# Patient Record
Sex: Female | Born: 1965 | Race: White | Hispanic: No | Marital: Married | Smoking: Never smoker
Health system: Southern US, Community
[De-identification: ages and names within clinical notes are randomized; demographics above are authoritative.]

## PROBLEM LIST (undated history)

## (undated) DIAGNOSIS — Z8669 Personal history of other diseases of the nervous system and sense organs: Secondary | ICD-10-CM

## (undated) DIAGNOSIS — R42 Dizziness and giddiness: Secondary | ICD-10-CM

## (undated) DIAGNOSIS — I10 Essential (primary) hypertension: Secondary | ICD-10-CM

## (undated) DIAGNOSIS — Z86711 Personal history of pulmonary embolism: Secondary | ICD-10-CM

## (undated) DIAGNOSIS — G43909 Migraine, unspecified, not intractable, without status migrainosus: Secondary | ICD-10-CM

## (undated) DIAGNOSIS — Z8701 Personal history of pneumonia (recurrent): Secondary | ICD-10-CM

## (undated) HISTORY — DX: Essential (primary) hypertension: I10

## (undated) HISTORY — DX: Personal history of pneumonia (recurrent): Z87.01

## (undated) HISTORY — PX: CHOLECYSTECTOMY: SHX55

## (undated) HISTORY — DX: Migraine, unspecified, not intractable, without status migrainosus: G43.909

## (undated) HISTORY — DX: Dizziness and giddiness: R42

---
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
## 1999-10-17 HISTORY — PX: APPENDECTOMY: SHX54

## 2006-01-05 ENCOUNTER — Ambulatory Visit: Payer: Self-pay | Admitting: Cardiology

## 2007-10-17 HISTORY — PX: PARTIAL HYSTERECTOMY: SHX80

## 2009-06-30 ENCOUNTER — Ambulatory Visit: Payer: Self-pay | Admitting: Cardiology

## 2009-10-16 DIAGNOSIS — Z86711 Personal history of pulmonary embolism: Secondary | ICD-10-CM

## 2009-10-16 HISTORY — DX: Personal history of pulmonary embolism: Z86.711

## 2011-01-23 DIAGNOSIS — R079 Chest pain, unspecified: Secondary | ICD-10-CM

## 2011-10-03 ENCOUNTER — Encounter: Payer: Self-pay | Admitting: Orthopedic Surgery

## 2011-10-03 ENCOUNTER — Telehealth: Payer: Self-pay | Admitting: Orthopedic Surgery

## 2011-10-03 ENCOUNTER — Ambulatory Visit (INDEPENDENT_AMBULATORY_CARE_PROVIDER_SITE_OTHER): Payer: BC Managed Care – PPO | Admitting: Orthopedic Surgery

## 2011-10-03 VITALS — BP 122/70 | Ht 62.0 in | Wt 125.0 lb

## 2011-10-03 DIAGNOSIS — M7500 Adhesive capsulitis of unspecified shoulder: Secondary | ICD-10-CM

## 2011-10-03 DIAGNOSIS — M75102 Unspecified rotator cuff tear or rupture of left shoulder, not specified as traumatic: Secondary | ICD-10-CM

## 2011-10-03 DIAGNOSIS — M67919 Unspecified disorder of synovium and tendon, unspecified shoulder: Secondary | ICD-10-CM

## 2011-10-03 MED ORDER — DICLOFENAC POTASSIUM 50 MG PO TABS: 50.0000 mg | ORAL_TABLET | Freq: Two times a day (BID) | ORAL | Status: AC

## 2011-10-03 MED ORDER — TRAMADOL-ACETAMINOPHEN 37.5-325 MG PO TABS: 1.0000 | ORAL_TABLET | ORAL | Status: AC | PRN

## 2011-10-03 NOTE — Patient Instructions (Addendum)
You have received a steroid shot. 15% of patients experience increased pain at the injection site with in the next 24 hours. This is best treated with ice and tylenol extra strength 2 tabs every 8 hours. If you are still having pain please call the office.   Start Physical therapy   Start 2 new medications   Call us if you have any problems

## 2011-10-03 NOTE — Telephone Encounter (Signed)
Patient having her physical therapy at [facility] in [place] rather than at [place], per patient's request due to proximity to workplace.  Order faxed to [facility] to fax [phone], the phone is # [phone].

## 2011-10-16 DIAGNOSIS — M75102 Unspecified rotator cuff tear or rupture of left shoulder, not specified as traumatic: Secondary | ICD-10-CM | POA: Insufficient documentation

## 2011-10-16 DIAGNOSIS — M7500 Adhesive capsulitis of unspecified shoulder: Secondary | ICD-10-CM | POA: Insufficient documentation

## 2011-10-16 NOTE — Progress Notes (Signed)
Patient ID: MENAAL RUSSUM, female   DOB: 1966-09-30, 45 y.o.   MRN: [id number] Chief complaint: Pain LEFT shoulder HPI:(81) This 45 year old female complains of pain in her LEFT shoulder after unsuccessful treatment by a chiropractor.  Her pain started gradually she did have a cortisone shot many years ago.  She complains of 10 out of 10 sharp dull throbbing burning pain in the LEFT upper extremity over the LEFT shoulder joint and acromion.  The pain is related to motion and activities related to overhead and extension.  Pain is somewhat relieved by bio freeze is worse with movement is described she does occasionally get some locking and catching as well as some tingling in her fingers.  Review of systems  ROS:(2) Review of systems shows a history of fatigue and migraines which affects her vision when she has that heartburn and frequency as well as vertigo.  She denies chest pain shortness of breath joint pain skin changes nervousness easy bleeding.   PFSH: (1)  Past Medical History  Diagnosis Date  . Migraines   . HTN (hypertension)   . Vertigo   . History of pneumonia      Physical Exam(12) GENERAL: normal development   CDV: pulses are normal   Skin: normal  Lymph: nodes were not palpable/normal  Psychiatric: awake, alert and oriented  Neuro: normal sensation  MSK Cervical exam shows normal range of motion with no tenderness and no negative Spurling's sign. 1 RIGHT shoulder seems to have full range of motion and normal strength.  Stability test in anterior posterior and inferior positions normal.  No tenderness. 2 LEFT shoulder tenderness along the anterior joint line and acromion.  No swelling.  A.c. Joint nontender.  Range of motion is limited active and passive.  Strength as could be tested normal. 3 LEFT shoulder provocative tests for impingement were limited by range of motion restriction but seemed normal. 4 LEFT hand full range of motion 5 LEFT elbow full range of  motion 6 Carpal tunnel test negative compression test  Imaging: X-rays were taken today they were normal  Assessment:  #1 frozen shoulder #2 rotator cuff syndrome    Plan:  #1 physical therapy #2 subacromial injections #3 anti-inflammatory and tramadol   Separately identifiable x-ray report   AP and lateral with 10 caudal tilt right  shoulder  Normal glenohumeral joint normal rotator outlet but no signs of chronic cuff disease  Impression normal shoulder x-rays    Subacromial Shoulder Injection Procedure Note  Pre-operative Diagnosis: left RC Syndrome  Post-operative Diagnosis: same  Indications: pain   Anesthesia: ethyl chloride   Procedure Details   Verbal consent was obtained for the procedure. The shoulder was prepped withalcohol and the skin was anesthetized. A 20 gauge needle was advanced into the subacromial space through posterior approach without difficulty  The space was then injected with 3 ml 1% lidocaine and 1 ml of depomedrol. The injection site was cleansed with isopropyl alcohol and a dressing was applied.  Complications:  None; patient tolerated the procedure well.

## 2013-06-19 DIAGNOSIS — R079 Chest pain, unspecified: Secondary | ICD-10-CM

## 2013-12-05 ENCOUNTER — Emergency Department (HOSPITAL_COMMUNITY): Payer: BC Managed Care – PPO

## 2013-12-05 ENCOUNTER — Encounter (HOSPITAL_COMMUNITY): Payer: Self-pay | Admitting: Emergency Medicine

## 2013-12-05 ENCOUNTER — Inpatient Hospital Stay (HOSPITAL_COMMUNITY)
Admission: EM | Admit: 2013-12-05 | Discharge: 2013-12-09 | DRG: 287 | Disposition: A | Discharge status: Home / Self Care | Payer: BC Managed Care – PPO | Attending: Internal Medicine | Admitting: Internal Medicine

## 2013-12-05 DIAGNOSIS — Z86711 Personal history of pulmonary embolism: Secondary | ICD-10-CM

## 2013-12-05 DIAGNOSIS — Z8669 Personal history of other diseases of the nervous system and sense organs: Secondary | ICD-10-CM

## 2013-12-05 DIAGNOSIS — I10 Essential (primary) hypertension: Secondary | ICD-10-CM | POA: Diagnosis present

## 2013-12-05 DIAGNOSIS — R911 Solitary pulmonary nodule: Secondary | ICD-10-CM | POA: Diagnosis present

## 2013-12-05 DIAGNOSIS — R079 Chest pain, unspecified: Principal | ICD-10-CM | POA: Diagnosis present

## 2013-12-05 DIAGNOSIS — M7500 Adhesive capsulitis of unspecified shoulder: Secondary | ICD-10-CM

## 2013-12-05 DIAGNOSIS — K219 Gastro-esophageal reflux disease without esophagitis: Secondary | ICD-10-CM | POA: Diagnosis present

## 2013-12-05 DIAGNOSIS — G510 Bell's palsy: Secondary | ICD-10-CM | POA: Diagnosis present

## 2013-12-05 DIAGNOSIS — M75102 Unspecified rotator cuff tear or rupture of left shoulder, not specified as traumatic: Secondary | ICD-10-CM

## 2013-12-05 DIAGNOSIS — Z8249 Family history of ischemic heart disease and other diseases of the circulatory system: Secondary | ICD-10-CM

## 2013-12-05 DIAGNOSIS — I200 Unstable angina: Secondary | ICD-10-CM

## 2013-12-05 DIAGNOSIS — G43909 Migraine, unspecified, not intractable, without status migrainosus: Secondary | ICD-10-CM | POA: Diagnosis present

## 2013-12-05 HISTORY — DX: Personal history of pulmonary embolism: Z86.711

## 2013-12-05 HISTORY — DX: Personal history of other diseases of the nervous system and sense organs: Z86.69

## 2013-12-05 LAB — BASIC METABOLIC PANEL
BUN: 15 mg/dL (ref 6–23)
CALCIUM: 9.6 mg/dL (ref 8.4–10.5)
CO2: 24 meq/L (ref 19–32)
CREATININE: 0.87 mg/dL (ref 0.50–1.10)
Chloride: 105 mEq/L (ref 96–112)
GFR calc Af Amer: 89 mL/min — ABNORMAL LOW (ref >90)
GFR calc non Af Amer: 77 mL/min — ABNORMAL LOW (ref >90)
Glucose, Bld: 89 mg/dL (ref 70–99)
Potassium: 3.8 mEq/L (ref 3.7–5.3)
Sodium: 141 mEq/L (ref 137–147)

## 2013-12-05 LAB — CBC WITH DIFFERENTIAL/PLATELET
BASOS PCT: 0 % (ref 0–1)
Basophils Absolute: 0 10*3/uL (ref 0.0–0.1)
EOS PCT: 1 % (ref 0–5)
Eosinophils Absolute: 0.1 10*3/uL (ref 0.0–0.7)
HCT: 37.1 % (ref 36.0–46.0)
Hemoglobin: 13 g/dL (ref 12.0–15.0)
LYMPHS PCT: 19 % (ref 12–46)
Lymphs Abs: 2 10*3/uL (ref 0.7–4.0)
MCH: 31.4 pg (ref 26.0–34.0)
MCHC: 35 g/dL (ref 30.0–36.0)
MCV: 89.6 fL (ref 78.0–100.0)
MONO ABS: 0.5 10*3/uL (ref 0.1–1.0)
Monocytes Relative: 5 % (ref 3–12)
Neutro Abs: 7.8 10*3/uL — ABNORMAL HIGH (ref 1.7–7.7)
Neutrophils Relative %: 74 % (ref 43–77)
Platelets: 211 10*3/uL (ref 150–400)
RBC: 4.14 MIL/uL (ref 3.87–5.11)
RDW: 12.9 % (ref 11.5–15.5)
WBC: 10.4 10*3/uL (ref 4.0–10.5)

## 2013-12-05 LAB — TROPONIN I
Troponin I: <0.3 ng/mL (ref <0.30)
Troponin I: <0.3 ng/mL (ref <0.30)

## 2013-12-05 LAB — PRO B NATRIURETIC PEPTIDE: Pro B Natriuretic peptide (BNP): 47.4 pg/mL (ref 0–125)

## 2013-12-05 MED ORDER — ONDANSETRON HCL 4 MG/2ML IJ SOLN: 4.0000 mg | Freq: Four times a day (QID) | INTRAMUSCULAR | Status: DC | PRN

## 2013-12-05 MED ORDER — NITROGLYCERIN 0.4 MG SL SUBL
0.4000 mg | SUBLINGUAL_TABLET | SUBLINGUAL | Status: DC | PRN
  Administered 2013-12-05 (×3): 0.4 mg via SUBLINGUAL

## 2013-12-05 MED ORDER — ACETAMINOPHEN 325 MG PO TABS: 650.0000 mg | ORAL_TABLET | Freq: Four times a day (QID) | ORAL | Status: DC | PRN

## 2013-12-05 MED ORDER — SODIUM CHLORIDE 0.9 % IJ SOLN
3.0000 mL | Freq: Two times a day (BID) | INTRAMUSCULAR | Status: DC
  Administered 2013-12-05 – 2013-12-08 (×6): 3 mL via INTRAVENOUS

## 2013-12-05 MED ORDER — ALUM & MAG HYDROXIDE-SIMETH 200-200-20 MG/5ML PO SUSP: 30.0000 mL | Freq: Four times a day (QID) | ORAL | Status: DC | PRN

## 2013-12-05 MED ORDER — ONDANSETRON HCL 4 MG PO TABS: 4.0000 mg | ORAL_TABLET | Freq: Four times a day (QID) | ORAL | Status: DC | PRN

## 2013-12-05 MED ORDER — METOPROLOL TARTRATE 12.5 MG HALF TABLET
12.5000 mg | ORAL_TABLET | Freq: Two times a day (BID) | ORAL | Status: DC
  Administered 2013-12-05 – 2013-12-08 (×7): 12.5 mg via ORAL
  Filled 2013-12-05 (×8): qty 1

## 2013-12-05 MED ORDER — ASPIRIN EC 325 MG PO TBEC
325.0000 mg | DELAYED_RELEASE_TABLET | Freq: Every day | ORAL | Status: DC
  Administered 2013-12-06 – 2013-12-09 (×3): 325 mg via ORAL
  Filled 2013-12-05 (×3): qty 1

## 2013-12-05 MED ORDER — SODIUM CHLORIDE 0.9 % IV SOLN: INTRAVENOUS | Status: DC

## 2013-12-05 MED ORDER — ENOXAPARIN SODIUM 60 MG/0.6ML ~~LOC~~ SOLN
60.0000 mg | Freq: Two times a day (BID) | SUBCUTANEOUS | Status: DC
  Administered 2013-12-06 – 2013-12-07 (×4): 60 mg via SUBCUTANEOUS
  Filled 2013-12-05 (×7): qty 0.6

## 2013-12-05 MED ORDER — IOHEXOL 350 MG/ML SOLN
100.0000 mL | Freq: Once | INTRAVENOUS | Status: AC | PRN
  Administered 2013-12-05: 100 mL via INTRAVENOUS

## 2013-12-05 MED ORDER — ALBUTEROL SULFATE (2.5 MG/3ML) 0.083% IN NEBU: 2.5000 mg | INHALATION_SOLUTION | RESPIRATORY_TRACT | Status: DC | PRN

## 2013-12-05 MED ORDER — ENOXAPARIN SODIUM 60 MG/0.6ML ~~LOC~~ SOLN
60.0000 mg | Freq: Two times a day (BID) | SUBCUTANEOUS | Status: DC
  Administered 2013-12-05: 60 mg via SUBCUTANEOUS
  Filled 2013-12-05 (×2): qty 0.8

## 2013-12-05 MED ORDER — ACETAMINOPHEN 650 MG RE SUPP: 650.0000 mg | Freq: Four times a day (QID) | RECTAL | Status: DC | PRN

## 2013-12-05 MED ORDER — TOPIRAMATE 25 MG PO TABS
150.0000 mg | ORAL_TABLET | Freq: Every day | ORAL | Status: DC
  Administered 2013-12-05 – 2013-12-08 (×4): 150 mg via ORAL
  Filled 2013-12-05 (×5): qty 2

## 2013-12-05 NOTE — ED Provider Notes (Addendum)
CSN: [id number]     Arrival date & time 12/05/13  1052 History  This chart was scribed for Shon Batonourtney F Soliyana Mcchristian, MD by Donne Anonayla Curran, ED Scribe. This patient was seen in room APA18/APA18 and the patient's care was started at 1055.    First MD Initiated Contact with Patient 12/05/13 1055     Chief Complaint  Patient presents with  . Chest Pain      The history is provided by the patient. No language interpreter was used.   HPI Comments: Patricia Silva is a 48 y.o. female brought in by ambulance, with hx of HTN and PE, who presents to the Emergency Department complaining of acute onset mid sternal CP (described sharp and crushing) that radiated to her left shoulder with associated diaphoresis. It began immediately PTA while she was at work (she is a Engineer, civil (consulting)nurse), and lasted 45 minutes. She states her pain was 10/10, and was relieved when EMS gave her 11 nitro en route. She took 4 baby Aspirin prior to arrival of EMS. She reports this pain feels similar to her previous PE. The cause of the previous PE is unknown. She denies recent travel. She denies SOB at the time of the pain or now. She is currently taking Augmentin for a sinus infection. She denies a hx of HTD, smoking, DM. She had a stress test performed by her PCP within the past year. She reports her father had a hx of early cardiac problems (multiple MIs before 40).    Past Medical History  Diagnosis Date  . Migraines   . HTN (hypertension)   . Vertigo   . History of pneumonia   . Personal history of PE (pulmonary embolism) 2011   Past Surgical History  Procedure Laterality Date  . Appendectomy    . Cholecystectomy     Family History  Problem Relation Age of Onset  . Heart disease    . Arthritis    . Lung disease    . Cancer     History  Substance Use Topics  . Smoking status: Never Smoker   . Smokeless tobacco: Not on file  . Alcohol Use: Yes   OB History   Grav Para Term Preterm Abortions TAB SAB Ect Mult Living                  Review of Systems  Constitutional: Positive for diaphoresis. Negative for fever.  Respiratory: Positive for chest tightness. Negative for cough and shortness of breath.   Cardiovascular: Negative for chest pain.  Gastrointestinal: Positive for nausea. Negative for vomiting and abdominal pain.  Genitourinary: Negative for dysuria.  Musculoskeletal: Negative for back pain.  Skin: Negative for rash.  Neurological: Negative for headaches.  Psychiatric/Behavioral: Negative for confusion.  All other systems reviewed and are negative.      Allergies  Imitrex and Morphine and related  Home Medications   Current Outpatient Rx  Name  Route  Sig  Dispense  Refill  . aspirin EC 81 MG tablet   Oral   Take 162 mg by mouth at bedtime.         Marland Kitchen. tiZANidine (ZANAFLEX) 4 MG tablet      Two tabs at onset of headache and two more if needed          . topiramate (TOPAMAX) 100 MG tablet      One and half tabs at bedtime           BP 131/74  Pulse 59  Temp(Src) 98.5 F (36.9 C) (Oral)  Resp 10  Ht 5\' 5"  (1.651 m)  Wt 125 lb (56.7 kg)  BMI 20.80 kg/m2  SpO2 100%  Physical Exam  Nursing note and vitals reviewed. Constitutional: She is oriented to person, place, and time. She appears well-developed and well-nourished.  HENT:  Head: Normocephalic and atraumatic.  Eyes: Pupils are equal, round, and reactive to light.  Neck: Neck supple.  Cardiovascular: Normal rate, regular rhythm and normal heart sounds.   No murmur heard. Pulmonary/Chest: Effort normal and breath sounds normal. No respiratory distress. She has no wheezes.  Abdominal: Soft. There is no tenderness.  Musculoskeletal: She exhibits no edema.  Neurological: She is alert and oriented to person, place, and time.  Skin: Skin is warm and dry.  Psychiatric: She has a normal mood and affect.    ED Course  Procedures (including critical care time) DIAGNOSTIC STUDIES: Oxygen Saturation is 100% on RA, normal by  my interpretation.    COORDINATION OF CARE: 11:00 AM Discussed treatment plan which includes CXR, CBC with differential, basic metabolic panel, pro b natriuretic peptide, EKG and troponin with pt at bedside and pt agreed to plan.    Labs Review Labs Reviewed  CBC WITH DIFFERENTIAL - Abnormal; Notable for the following:    Neutro Abs 7.8 (*)    All other components within normal limits  BASIC METABOLIC PANEL - Abnormal; Notable for the following:    GFR calc non Af Amer 77 (*)    GFR calc Af Amer 89 (*)    All other components within normal limits  PRO B NATRIURETIC PEPTIDE  TROPONIN I   Imaging Review Dg Chest 2 View  12/05/2013   CLINICAL DATA:  Chest pain  EXAM: CHEST  2 VIEW  COMPARISON:  None.  FINDINGS: The lungs are adequately inflated and clear. There is no focal infiltrate. There is no pleural effusion or pneumothorax. The cardiopericardial silhouette is normal in size. The pulmonary vascularity is not engorged. The mediastinum is normal in width. There is no pleural effusion. The observed portions of the bony thorax appear normal.  IMPRESSION: There is no evidence of active cardiopulmonary disease. 1 cannot exclude acute bronchitis in the appropriate clinical setting.   Electronically Signed   By: David  [place]   On: 12/05/2013 11:40   Ct Angio Chest W/cm &/or Wo Cm  12/05/2013   CLINICAL DATA:  Chest pain  EXAM: CT ANGIOGRAPHY CHEST WITH CONTRAST  TECHNIQUE: Multidetector CT imaging of the chest was performed using the standard protocol during bolus administration of intravenous contrast. Multiplanar CT image reconstructions and MIPs were obtained to evaluate the vascular anatomy.  CONTRAST:  OMNIPAQUE IOHEXOL 350 MG/ML SOLN  COMPARISON:  Chest CT angiogram January 21, 2010 and chest radiograph December 05, 2013  FINDINGS: There is no demonstrable pulmonary embolus. There is no thoracic aortic aneurysm or dissection.  On axial slice 55, series 5, there is a stable 3 mm nodular  opacity in the superior segment of the right lower lobe. Elsewhere the lungs are clear. No edema or consolidation.  There is no appreciable thoracic adenopathy. The pericardium is not thickened.  Visualized upper abdominal structures appear normal. There are no blastic or lytic bone lesions.  Review of the MIP images confirms the above findings.  IMPRESSION: No demonstrable pulmonary embolus. No edema or consolidation. Stable 3 mm nodular opacity right lower lobe. Stability since 2011 is consistent with benign etiology.   Electronically Signed   By: Chrissie Noa  Margarita Grizzle M.D.   On: 12/05/2013 13:18    EKG Interpretation    Date/Time:  Friday December 05 2013 11:01:12 EST Ventricular Rate:  63 PR Interval:  146 QRS Duration: 78 QT Interval:  404 QTC Calculation: 413 R Axis:   62 Text Interpretation:  Normal sinus rhythm Nonspecific ST abnormality Abnormal ECG No previous ECGs available Confirmed by Patricia Vogelgesang  MD, Patricia Silva ([id number]) on 12/05/2013 11:14:50 AM            MDM   Final diagnoses:  Chest pain    Patient presents with substernal chest pain. She has risk factors for ACS and has a history of PE. She is chest pain-free at this time. She received aspirin in route. EKG is nonischemic.  Patient did have one recurrent episode of chest pain in the ER. Repeat EKG continues to be reassuring. CT chest is negative for PE.  Will admit patient for formal rule out.  I personally performed the services described in this documentation, which was scribed in my presence. The recorded information has been reviewed and is accurate.   Shon Baton, MD 12/05/13 1332  Have spoken with Dr. Waymon Amato who would like the patient to be transferred to the hospital for possible stress testing tomorrow. I have put in transfer orders and EMTALA.  Shon Baton, MD 12/05/13 1455

## 2013-12-05 NOTE — ED Notes (Addendum)
Pt was at work and experienced chest pain 10/10, EKG, FS 121, EMS started IV, gave 324 ASA, nitro x1 that relieved pain completely, pt denies chest pain currently. Pt has HX of PE.

## 2013-12-05 NOTE — H&P (Addendum)
History and Physical  Patricia LoosenKaron D Monteith WUJ:[id number] DOB: 06/26/1966 DOA: 12/05/2013  Referring physician: EDP PCP: Ignatius SpeckingVYAS,DHRUV B., MD  Outpatient Specialists:  1. 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 The patient states that she was in her usual state of health when she went to work this morning. While at work, she was sitting at her desk when she experienced sudden onset of lower substernal crushing type of chest pain, 10/10 in severity which she initially felt was gas-related pain and took some TUMS without relief. Subsequently this pain radiated to the left shoulder, was associated with dyspnea and diaphoresis. She then had worsening of chest pain with deep inspiration. EMS was called and on route to the ED, she received a dose of sublingual nitroglycerin with prompt relief of chest pain-pain lasted about 45 minutes in total. Workup in the ED revealed troponin x1 negative, EKG without acute changes and CT chest without PE. She had couple of episodes of chest pain again in the ED which were relieved by sublingual nitroglycerin. Currently she is chest pain-free but has mild headache from NTG. She states that this pain is not similar to her GERD pain. Cardiology has evaluated patient in the ED and [facility]. Hospitalist admission requested.   Review of Systems: All systems reviewed and apart from history of presenting illness, are negative.  Past Medical History  Diagnosis Date  . Migraines   . HTN (hypertension)   . Vertigo   . History of pneumonia   . Personal history of PE  (pulmonary embolism) 2011  . History of Bell's palsy    Past Surgical History  Procedure Laterality Date  . Appendectomy    . Cholecystectomy     Social History:  reports that she has never smoked. She does not have any smokeless tobacco history on file. She reports that she drinks alcohol. She reports that she does not use illicit drugs. Married. Independent of activities of daily living.  Allergies  Allergen Reactions  . Imitrex [Sumatriptan Base] Anaphylaxis and Other (See Comments)    Patient states "heart beats really fast and throat itches and my throat feels like it gets tight"  . Morphine And Related     Family History  Problem Relation Age of Onset  . Heart disease    . Arthritis    . Lung disease    . Cancer    . Pulmonary embolism Mother   . Heart disease Father   . Pulmonary embolism Brother     Prior to Admission medications   Medication Sig Start Date End Date Taking? Authorizing Provider  aspirin EC 81 MG tablet Take 162 mg by mouth at bedtime.   Yes Historical Provider, MD  tiZANidine (ZANAFLEX) 4 MG tablet Two tabs at onset of headache and two more if needed    Yes Historical Provider, MD  topiramate (TOPAMAX) 100 MG tablet One and half tabs at bedtime    Yes Historical Provider, MD   Physical Exam: Filed Vitals:   12/05/13 1345 12/05/13 1400 12/05/13 1415 12/05/13 1430  BP: 118/70 117/62 126/60 139/74  Pulse: 57 65  70 77  Temp:      TempSrc:      Resp: 13 10 14 15   Height:      Weight:      SpO2:       temperature 98.46F and oxygen saturation 100%.   General exam: Moderately built and nourished young female patient, lying comfortably supine on the gurney in no obvious distress.  Head, eyes and ENT: Nontraumatic and normocephalic. Pupils equally reacting to light and accommodation. Oral mucosa moist.  Neck: Supple. No JVD, carotid bruit or thyromegaly.  Lymphatics: No lymphadenopathy.  Respiratory system: Clear to auscultation. No increased  work of breathing. No reproducible chest pain.  Cardiovascular system: S1 and S2 heard, RRR. No JVD, murmurs, gallops, clicks or pedal edema.  Gastrointestinal system: Abdomen is nondistended, soft and nontender. Normal bowel sounds heard. No organomegaly or masses appreciated.  Central nervous system: Alert and oriented. No new focal neurological deficits. Diminished right nasolabial fold from prior Bell's palsy.  Extremities: Symmetric 5 x 5 power. Peripheral pulses symmetrically felt.   Skin: No rashes or acute findings.  Musculoskeletal system: Negative exam.  Psychiatry: Pleasant and cooperative.   Labs on Admission:  Basic Metabolic Panel:  Recent Labs Lab 12/05/13 1140  NA 141  K 3.8  CL 105  CO2 24  GLUCOSE 89  BUN 15  CREATININE 0.87  CALCIUM 9.6   Liver Function Tests: No results found for this basename: AST, ALT, ALKPHOS, BILITOT, PROT, ALBUMIN,  in the last 168 hours No results found for this basename: LIPASE, AMYLASE,  in the last 168 hours No results found for this basename: AMMONIA,  in the last 168 hours CBC:  Recent Labs Lab 12/05/13 1140  WBC 10.4  NEUTROABS 7.8*  HGB 13.0  HCT 37.1  MCV 89.6  PLT 211   Cardiac Enzymes:  Recent Labs Lab 12/05/13 1140  TROPONINI <0.30    BNP (last 3 results)  Recent Labs  12/05/13 1140  PROBNP 47.4   CBG: No results found for this basename: GLUCAP,  in the last 168 hours  Radiological Exams on Admission: Dg Chest 2 View  12/05/2013   CLINICAL DATA:  Chest pain  EXAM: CHEST  2 VIEW  COMPARISON:  None.  FINDINGS: The lungs are adequately inflated and clear. There is no focal infiltrate. There is no pleural effusion or pneumothorax. The cardiopericardial silhouette is normal in size. The pulmonary vascularity is not engorged. The mediastinum is normal in width. There is no pleural effusion. The observed portions of the bony thorax appear normal.  IMPRESSION: There is no evidence of active  cardiopulmonary disease. 1 cannot exclude acute bronchitis in the appropriate clinical setting.   Electronically Signed   By: David  [place]   On: 12/05/2013 11:40   Ct Angio Chest W/cm &/or Wo Cm  12/05/2013   CLINICAL DATA:  Chest pain  EXAM: CT ANGIOGRAPHY CHEST WITH CONTRAST  TECHNIQUE: Multidetector CT imaging of the chest was performed using the standard protocol during bolus administration of intravenous contrast. Multiplanar CT image reconstructions and MIPs were obtained to evaluate the vascular anatomy.  CONTRAST:  OMNIPAQUE IOHEXOL 350 MG/ML SOLN  COMPARISON:  Chest CT angiogram January 21, 2010 and chest radiograph December 05, 2013  FINDINGS: There is no demonstrable pulmonary embolus. There is no thoracic aortic aneurysm or dissection.  On axial slice 55, series 5, there is a stable 3 mm nodular opacity in the superior segment of the right lower lobe. Elsewhere the lungs are  clear. No edema or consolidation.  There is no appreciable thoracic adenopathy. The pericardium is not thickened.  Visualized upper abdominal structures appear normal. There are no blastic or lytic bone lesions.  Review of the MIP images confirms the above findings.  IMPRESSION: No demonstrable pulmonary embolus. No edema or consolidation. Stable 3 mm nodular opacity right lower lobe. Stability since 2011 is consistent with benign etiology.   Electronically Signed   By: Bretta Bang M.D.   On: 12/05/2013 13:18    EKG: Independently reviewed. Normal sinus rhythm, normal axis and no acute changes.  Assessment/Plan Principal Problem:   Chest pain Active Problems:   History of pulmonary embolism   History of Bell's palsy   HTN (hypertension)   1. Chest pain: Concerning for unstable angina. She has mostly typical and some atypical features for ischemic type of chest pain. Risk factors for CAD are hypertension and strong family history of CAD. Since she has strong personal and family history of VTE, CTA chest was  done and PE ruled out. Admit to telemetry at [facility] for observation and evaluation. Cycle cardiac enzymes. Continue aspirin and sublingual NTG. Cardiology has consulted at [facility] ED and recommend initiation of full dose Lovenox and low dose metoprolol and transfer to [facility] for further evaluation-possibly cardiac cath. Recent nuclear stress testing within the last 6 months was negative. Check 2-D echo and fasting lipid panel. 2. Hypertension: Controlled off medications. 3. History of pulmonary embolism: Negative CTA chest this admission. 4. History of recurrent Bell's palsy: 5. Pulmonary nodule: Stable 3 MM RLL nodule-stable compared to prior imaging. Outpatient followup. 6. History of migraine:     Code Status: Full  Family Communication: Discussed with spouse at bedside.  Disposition Plan: Transfer to [facility]   Time spent: 55 minutes  [facility], MD, FACP, FHM. [facility] Pager [phone]  If 7PM-7AM, please contact night-coverage [email or web address] Password [facility] 12/05/2013, 3:26 PM

## 2013-12-05 NOTE — Consult Note (Signed)
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 Reports episode of 10/10 chest pressure in mid chest with associated diaphoresis starting this morning while sitting at work. Pain lasted for approx 45 minutes and was not resolved until she took NG by EMS. Second episode occurred in ER with similar symptoms, relieved once again with NG. She reports prior history of chest pain several months ago that was much more mild. States she had a stress test ordered by her PCP around that timethat was normal. Her symptoms today are much more severe and different from prior. She also reports some increased DOE over the last few weeks that she has attributed to recent sinus infection. No orthopnea, no PND, no LE edema.   EKG NSR, LAE, non-spec ST/T changes, trop neg x 1, pro-BNP 47, Cr 0.87, GFR 89, K 3.8. CT PE is negative.    CAD risk factors: HTN, father with MI is his 66s.    Allergies  Allergen Reactions  . Imitrex [Sumatriptan Base] Anaphylaxis and Other (See Comments)    Patient states "heart beats really fast and throat itches and my throat feels like it gets tight"  . Morphine And Related     Medications Scheduled Medications:     Infusions:     PRN Medications:  nitroGLYCERIN   Past Medical History  Diagnosis Date  . Migraines   . HTN (hypertension)   . Vertigo   . History of pneumonia   . Personal history of PE (pulmonary embolism) 2011    Past Surgical History  Procedure Laterality Date  . Appendectomy    . Cholecystectomy      Family History  Problem Relation Age of Onset  . Heart disease    . Arthritis    . Lung disease    . Cancer      Social History [name] reports that she has never smoked. She does not have any smokeless tobacco history on file. [name] reports that she drinks alcohol.  Review of Systems CONSTITUTIONAL: No  weight loss, fever, chills, weakness or fatigue.  HEENT: Eyes: No visual loss, blurred vision, double vision or yellow sclerae. No hearing loss, sneezing, congestion, runny nose or sore throat.  SKIN: No rash or itching.  CARDIOVASCULAR: per HPI RESPIRATORY: No shortness of breath, cough or sputum.  GASTROINTESTINAL: No anorexia, nausea, vomiting or diarrhea. No abdominal pain or blood.  GENITOURINARY: no polyuria, no dysuria NEUROLOGICAL: No headache, dizziness, syncope, paralysis, ataxia, numbness or tingling in the extremities. No change in bowel or bladder control.  MUSCULOSKELETAL: No muscle, back pain, joint pain or stiffness.  HEMATOLOGIC: No anemia, bleeding or bruising.  LYMPHATICS: No enlarged nodes. No history of splenectomy.  PSYCHIATRIC: No history of depression or anxiety.      Physical Examination Blood pressure 120/75, pulse 57, temperature 98.5 F (36.9 C), temperature source Oral, resp. rate 11, height 5\' 5"  (1.651 m), weight 125 lb (56.7 kg), SpO2 100.00%. No intake or output data in the 24 hours ending 12/05/13 1410  Cardiovascular: RRR, no m/r/g,   Respiratory: CTAB  GI: abdomen soft, NT, ND  MSK: no LE edema  Neuro: no focal deficits  Psych: appropriate affect   Lab Results  Basic Metabolic Panel:  Recent Labs Lab 12/05/13 1140  NA 141  K 3.8  CL 105  CO2 24  GLUCOSE 89  BUN 15  CREATININE 0.87  CALCIUM 9.6    Liver Function Tests: No results found for this basename: AST, ALT, ALKPHOS, BILITOT, PROT, ALBUMIN,  in the last 168 hours  CBC:  Recent Labs Lab 12/05/13 1140  WBC 10.4  NEUTROABS 7.8*  HGB 13.0  HCT 37.1  MCV 89.6  PLT 211    Cardiac Enzymes:  Recent Labs Lab 12/05/13 1140  TROPONINI <0.30    BNP: No components found with this basename: POCBNP,    ECG NSR, LAE, non-spec ST/T changes  Imaging 12/05/13 CXR FINDINGS:  The lungs are adequately inflated and clear. There is no focal  infiltrate. There is no  pleural effusion or pneumothorax. The  cardiopericardial silhouette is normal in size. The pulmonary  vascularity is not engorged. The mediastinum is normal in width.  There is no pleural effusion. The observed portions of the bony  thorax appear normal.  IMPRESSION:  There is no evidence of active cardiopulmonary disease. 1 cannot  exclude acute bronchitis in the appropriate clinical setting.  12/05/13 CT PE IMPRESSION:  No demonstrable pulmonary embolus. No edema or consolidation. Stable  3 mm nodular opacity right lower lobe. Stability since 2011 is  consistent with benign etiology.   Impression/Recommendations  1. Chest pain - by history symptoms are concerning for angina, 10/10 midchest tightness w/ associated diaphoresis lasting approx 40 minutes, resolved with NG. She has had 2 episodes today both resolved with NG. She has a strong family history of early CAD, with her father having multiple MI in his early 7140s.  - we were able to obtain the stress test she referred to, [facility]. She exercised 8 min 30 sec, achieving 10 METs, 86% of THR, without ischemic EKG changes, no perfusion defects.LVEF 74%.  - Despite these fairly recent stress test results, given her strong family history of early CAD and progression of anginal like symptoms [facility] and [facility] diagnosis of CAD. We are limited from cardiac standpoint in our ability to manage her here at [facility] over the weekend.  - recommend ASA, statin, and beta blocker. - check lipid panel, echo - her TIMI score is 2 consistent with low risk for major cardiac events. However, she has had recurring symptoms since being in the ER, will initiate anticoagulation with lovenox.    Patricia RichJonathan Azoria Silva, M.D., F.A.C.C.

## 2013-12-06 DIAGNOSIS — I200 Unstable angina: Secondary | ICD-10-CM | POA: Diagnosis present

## 2013-12-06 DIAGNOSIS — I059 Rheumatic mitral valve disease, unspecified: Secondary | ICD-10-CM

## 2013-12-06 DIAGNOSIS — Z8249 Family history of ischemic heart disease and other diseases of the circulatory system: Secondary | ICD-10-CM

## 2013-12-06 DIAGNOSIS — Z86711 Personal history of pulmonary embolism: Secondary | ICD-10-CM

## 2013-12-06 DIAGNOSIS — G43909 Migraine, unspecified, not intractable, without status migrainosus: Secondary | ICD-10-CM

## 2013-12-06 LAB — LIPID PANEL
CHOL/HDL RATIO: 2.5 ratio
Cholesterol: 130 mg/dL (ref 0–200)
HDL: 52 mg/dL (ref >39)
LDL CALC: 62 mg/dL (ref 0–99)
Triglycerides: 82 mg/dL (ref <150)
VLDL: 16 mg/dL (ref 0–40)

## 2013-12-06 LAB — TROPONIN I: Troponin I: <0.3 ng/mL (ref <0.30)

## 2013-12-06 NOTE — Progress Notes (Signed)
Subjective:  Yesterday reported episode of 10 over 10 chest pressure with diaphoresis starting in the morning while sitting at work lasting 50 minutes and not resolve until nitroglycerin was administered. She had more mild chest discomfort several months ago. She had a stress test ordered by her primary physician a few months ago that was "normal". Exercise stress nuclear at [facility] 9/14-exercise for 8 minutes and 30 seconds without ischemic EKG changes with no perfusion defects. EF was 74%. Symptoms yesterday were more severe. Mild shortness of breath as well. She thought this was secondary to a sinus infection.  EKG shows nonspecific ST changes, BNP was normal at 47, creatinine 0.87, CT scan of chest was negative PE.  Father had myocardial infarction in his 79s.  Given the severity of her symptoms, she was seen by Dr. Wyline Mood at [facility] and transferred to [place] for further evaluation and consideration of left heart catheterization for definitive diagnosis of coronary artery disease.  Extensive review of medical records.  Lovenox was begun.  Currently she is feeling well, no chest discomfort. Mildly anxious.  Objective:  Vital Signs in the last 24 hours: Temp:  [97.1 F (36.2 C)-98.5 F (36.9 C)] 97.8 F (36.6 C) (02/21 0523) Pulse Rate:  [57-77] 70 (02/21 0523) Resp:  [10-20] 18 (02/21 0523) BP: (103-139)/(55-105) 116/73 mmHg (02/21 0523) SpO2:  [99 %-100 %] 100 % (02/21 0523) Weight:  [125 lb (56.7 kg)-126 lb 15.8 oz (57.6 kg)] 126 lb 15.8 oz (57.6 kg) (02/21 0523)  Intake/Output from previous day:     Physical Exam: General: Well developed, well nourished, in no acute distress. Head:  Normocephalic and atraumatic. Lungs: Clear to auscultation and percussion. Heart: Normal S1 and S2.  No murmur, rubs or gallops.  Abdomen: soft, non-tender, positive bowel sounds. Extremities: No clubbing or cyanosis. No edema. Neurologic: Alert and oriented x 3.  Anxious    Lab Results:  Recent Labs  12/05/13 1140  WBC 10.4  HGB 13.0  PLT 211    Recent Labs  12/05/13 1140  NA 141  K 3.8  CL 105  CO2 24  GLUCOSE 89  BUN 15  CREATININE 0.87    Recent Labs  12/06/13 0111 12/06/13 0815  TROPONINI <0.30 <0.30    Recent Labs  12/06/13 0115  CHOL 130   LDL 62. Imaging: Dg Chest 2 View  12/05/2013   CLINICAL DATA:  Chest pain  EXAM: CHEST  2 VIEW  COMPARISON:  None.  FINDINGS: The lungs are adequately inflated and clear. There is no focal infiltrate. There is no pleural effusion or pneumothorax. The cardiopericardial silhouette is normal in size. The pulmonary vascularity is not engorged. The mediastinum is normal in width. There is no pleural effusion. The observed portions of the bony thorax appear normal.  IMPRESSION: There is no evidence of active cardiopulmonary disease. 1 cannot exclude acute bronchitis in the appropriate clinical setting.   Electronically Signed   By: David  [place]   On: 12/05/2013 11:40   Ct Angio Chest W/cm &/or Wo Cm  12/05/2013   CLINICAL DATA:  Chest pain  EXAM: CT ANGIOGRAPHY CHEST WITH CONTRAST  TECHNIQUE: Multidetector CT imaging of the chest was performed using the standard protocol during bolus administration of intravenous contrast. Multiplanar CT image reconstructions and MIPs were obtained to evaluate the vascular anatomy.  CONTRAST:  OMNIPAQUE IOHEXOL 350 MG/ML SOLN  COMPARISON:  Chest CT angiogram January 21, 2010 and chest radiograph December 05, 2013  FINDINGS:  There is no demonstrable pulmonary embolus. There is no thoracic aortic aneurysm or dissection.  On axial slice 55, series 5, there is a stable 3 mm nodular opacity in the superior segment of the right lower lobe. Elsewhere the lungs are clear. No edema or consolidation.  There is no appreciable thoracic adenopathy. The pericardium is not thickened.  Visualized upper abdominal structures appear normal. There are no blastic or lytic bone  lesions.  Review of the MIP images confirms the above findings.  IMPRESSION: No demonstrable pulmonary embolus. No edema or consolidation. Stable 3 mm nodular opacity right lower lobe. Stability since 2011 is consistent with benign etiology.   Electronically Signed   By: Bretta BangWilliam  Woodruff M.D.   On: 12/05/2013 13:18   Personally viewed.   Telemetry: No adverse arrhythmias Personally viewed.   EKG:  12/05/13 at 11:26 AM-sinus rhythm with nonspecific ST-T wave changes.  Cardiac Studies:  Nuclear stress test as above.  Assessment/Plan:  Principal Problem:   Chest pain Active Problems:   History of pulmonary embolism   History of Bell's palsy   HTN (hypertension)   48 year old female with strong early family history of CAD with return of severe chest discomfort, nonspecific ST-T wave changes on EKG with prior nuclear stress test 9/14 that was low risk with no ischemia.  1. Chest pain-Dr. Wyline MoodBranch with cardiology saw her in consultation yesterday and sent her to [place] for further evaluation for possible heart catheterization. I discussed with her the risks and benefits of cardiac catheterization including stroke, heart attack, death, renal impairment, bleeding. Questions were answered. She is willing to proceed. This will be performed on Monday unless symptoms worsen or become more worrisome over the weekend. Discussion.  2. History of pulmonary embolism-no evidence of PE on CT.  3. Early family history of CAD-father had MI in his 8040s.  We will continue to monitor. Allow for diet. N.p.o. past midnight on Sunday.  Patricia Silva 12/06/2013, 9:27 AM

## 2013-12-06 NOTE — Progress Notes (Signed)
Pt had a 3.22 second pause, stripped saved to EPIC. Pt was asymptomatic, resting in bed. VSS. Patricia McgregorMary Silva notified, will continue to monitor.

## 2013-12-06 NOTE — Progress Notes (Signed)
  Echocardiogram 2D Echocardiogram has been performed.  Cathie BeamsGREGORY, Tania Steinhauser 12/06/2013, 11:52 AM

## 2013-12-06 NOTE — Progress Notes (Signed)
[facility] PROGRESS NOTE   MONAI HINDES ZOX:[id number] DOB: Jan 01, 1966 DOA: 12/05/2013 PCP: Ignatius Specking., MD  HPI/Subjective: Denies any chest pain since admission to the hospital. Denies any palpitations or shortness of breath.  Assessment/Plan: Principal Problem:   Chest pain Active Problems:   History of pulmonary embolism   History of Bell's palsy   HTN (hypertension)    Chest pain -Pain characteristics is concerning for unstable angina, cardiology on board. -CT angiography was done and rule out PE. -Negative 3 sets of cardiac enzymes so far. -Patient started on metoprolol, anticoagulation with Lovenox and aspirin/Plavix. -Patient for cardiac catheterization on Monday.  GERD -History of GERD and remote history of esophageal stricture S/P dilatation. -Currently no active symptoms. -Be chest pain she had is not similar to the GERD burning sensation she used to get before.  History of PE -CT angiography done this admission showed no evidence of PE or other pulmonary acute findings. -There is 3 mm right lower lobe nodule stable compared with prior imaging, outpatient followup is recommended.  Code Status: Full code Family Communication: Plan discussed with the patient. Disposition Plan: Remains inpatient   Consultants:  Cardiology  Procedures:  None  Antibiotics:  None   Objective: Filed Vitals:   12/06/13 0947  BP: 114/63  Pulse: 68  Temp:   Resp:    No intake or output data in the 24 hours ending 12/06/13 1135 Filed Weights   12/05/13 1101 12/06/13 0523  Weight: 56.7 kg (125 lb) 57.6 kg (126 lb 15.8 oz)    Exam: General: Alert and awake, oriented x3, not in any acute distress. HEENT: anicteric sclera, pupils reactive to light and accommodation, EOMI CVS: S1-S2 clear, no murmur rubs or gallops Chest: clear to auscultation bilaterally, no wheezing, rales or rhonchi Abdomen: soft nontender, nondistended, normal bowel sounds, no  organomegaly Extremities: no cyanosis, clubbing or edema noted bilaterally Neuro: Cranial nerves II-XII intact, no focal neurological deficits  Data Reviewed: Basic Metabolic Panel:  Recent Labs Lab 12/05/13 1140  NA 141  K 3.8  CL 105  CO2 24  GLUCOSE 89  BUN 15  CREATININE 0.87  CALCIUM 9.6   Liver Function Tests: No results found for this basename: AST, ALT, ALKPHOS, BILITOT, PROT, ALBUMIN,  in the last 168 hours No results found for this basename: LIPASE, AMYLASE,  in the last 168 hours No results found for this basename: AMMONIA,  in the last 168 hours CBC:  Recent Labs Lab 12/05/13 1140  WBC 10.4  NEUTROABS 7.8*  HGB 13.0  HCT 37.1  MCV 89.6  PLT 211   Cardiac Enzymes:  Recent Labs Lab 12/05/13 1140 12/05/13 2001 12/06/13 0111 12/06/13 0815  TROPONINI <0.30 <0.30 <0.30 <0.30   BNP (last 3 results)  Recent Labs  12/05/13 1140  PROBNP 47.4   CBG: No results found for this basename: GLUCAP,  in the last 168 hours  Micro No results found for this or any previous visit (from the past 240 hour(s)).   Studies: Dg Chest 2 View  12/05/2013   CLINICAL DATA:  Chest pain  EXAM: CHEST  2 VIEW  COMPARISON:  None.  FINDINGS: The lungs are adequately inflated and clear. There is no focal infiltrate. There is no pleural effusion or pneumothorax. The cardiopericardial silhouette is normal in size. The pulmonary vascularity is not engorged. The mediastinum is normal in width. There is no pleural effusion. The observed portions of the bony thorax appear normal.  IMPRESSION: There is no evidence of  active cardiopulmonary disease. 1 cannot exclude acute bronchitis in the appropriate clinical setting.   Electronically Signed   By: David  [place]   On: 12/05/2013 11:40   Ct Angio Chest W/cm &/or Wo Cm  12/05/2013   CLINICAL DATA:  Chest pain  EXAM: CT ANGIOGRAPHY CHEST WITH CONTRAST  TECHNIQUE: Multidetector CT imaging of the chest was performed using the standard protocol  during bolus administration of intravenous contrast. Multiplanar CT image reconstructions and MIPs were obtained to evaluate the vascular anatomy.  CONTRAST:  100mL OMNIPAQUE IOHEXOL 350 MG/ML SOLN  COMPARISON:  Chest CT angiogram January 21, 2010 and chest radiograph December 05, 2013  FINDINGS: There is no demonstrable pulmonary embolus. There is no thoracic aortic aneurysm or dissection.  On axial slice 55, series 5, there is a stable 3 mm nodular opacity in the superior segment of the right lower lobe. Elsewhere the lungs are clear. No edema or consolidation.  There is no appreciable thoracic adenopathy. The pericardium is not thickened.  Visualized upper abdominal structures appear normal. There are no blastic or lytic bone lesions.  Review of the MIP images confirms the above findings.  IMPRESSION: No demonstrable pulmonary embolus. No edema or consolidation. Stable 3 mm nodular opacity right lower lobe. Stability since 2011 is consistent with benign etiology.   Electronically Signed   By: Bretta BangWilliam  Woodruff M.D.   On: 12/05/2013 13:18    Scheduled Meds: . aspirin EC  325 mg Oral Daily  . enoxaparin (LOVENOX) injection  60 mg Subcutaneous Q12H  . metoprolol tartrate  12.5 mg Oral BID  . sodium chloride  3 mL Intravenous Q12H  . topiramate  150 mg Oral QHS   Continuous Infusions: . sodium chloride         Time spent: 35 minutes    [facility] Center[facility]MAHI,Francia Verry A  [facility] Pager [phone] If 7PM-7AM, please contact night-coverage at [email or web address], password [email or web address] 12/06/2013, 11:35 AM  LOS: 1 day

## 2013-12-07 DIAGNOSIS — I200 Unstable angina: Secondary | ICD-10-CM

## 2013-12-07 DIAGNOSIS — M67919 Unspecified disorder of synovium and tendon, unspecified shoulder: Secondary | ICD-10-CM

## 2013-12-07 DIAGNOSIS — M719 Bursopathy, unspecified: Secondary | ICD-10-CM

## 2013-12-07 DIAGNOSIS — M7500 Adhesive capsulitis of unspecified shoulder: Secondary | ICD-10-CM

## 2013-12-07 MED ORDER — SODIUM CHLORIDE 0.9 % IV SOLN: 250.0000 mL | INTRAVENOUS | Status: DC | PRN

## 2013-12-07 MED ORDER — SODIUM CHLORIDE 0.9 % IJ SOLN: 3.0000 mL | INTRAMUSCULAR | Status: DC | PRN

## 2013-12-07 MED ORDER — ASPIRIN 81 MG PO CHEW
81.0000 mg | CHEWABLE_TABLET | ORAL | Status: AC
  Administered 2013-12-08: 81 mg via ORAL
  Filled 2013-12-07: qty 1

## 2013-12-07 MED ORDER — SODIUM CHLORIDE 0.9 % IV SOLN
1.0000 mL/kg/h | INTRAVENOUS | Status: DC
  Administered 2013-12-08: 1 mL/kg/h via INTRAVENOUS

## 2013-12-07 MED ORDER — SODIUM CHLORIDE 0.9 % IJ SOLN
3.0000 mL | Freq: Two times a day (BID) | INTRAMUSCULAR | Status: DC
  Administered 2013-12-07 – 2013-12-08 (×2): 3 mL via INTRAVENOUS

## 2013-12-07 NOTE — Progress Notes (Signed)
[facility] PROGRESS NOTE   Brandt LoosenKaron D Macauley ZOX:[id number] DOB: 04/17/1966 DOA: 12/05/2013 PCP: Ignatius SpeckingVYAS,DHRUV B., MD  HPI/Subjective: Denies any chest pain since admission to the hospital.  Assessment/Plan: Principal Problem:   Chest pain Active Problems:   History of pulmonary embolism   History of Bell's palsy   HTN (hypertension)   Unstable angina    Chest pain -Pain characteristics is concerning for unstable angina, cardiology on board. -CT angiography was done and rule out PE. -Negative 3 sets of cardiac enzymes so far. -Patient started on metoprolol, anticoagulation with Lovenox and aspirin/Plavix. -Patient for cardiac catheterization on Monday.  GERD -History of GERD and remote history of esophageal stricture S/P dilatation. -Currently no active symptoms. -Be chest pain she had is not similar to the GERD burning sensation she used to get before.  History of PE -CT angiography done this admission showed no evidence of PE or other pulmonary acute findings. -There is 3 mm right lower lobe nodule stable compared with prior imaging, outpatient followup is recommended.  Code Status: Full code Family Communication: Plan discussed with the patient. Disposition Plan: Remains inpatient   Consultants:  Cardiology  Procedures:  None  Antibiotics:  None   Objective: Filed Vitals:   12/07/13 1038  BP:   Pulse: 79  Temp:   Resp:     Intake/Output Summary (Last 24 hours) at 12/07/13 1047 Last data filed at 12/06/13 1700  Gross per 24 hour  Intake    240 ml  Output      0 ml  Net    240 ml   Filed Weights   12/05/13 1101 12/06/13 0523  Weight: 56.7 kg (125 lb) 57.6 kg (126 lb 15.8 oz)    Exam: General: Alert and awake, oriented x3, not in any acute distress. HEENT: anicteric sclera, pupils reactive to light and accommodation, EOMI CVS: S1-S2 clear, no murmur rubs or gallops Chest: clear to auscultation bilaterally, no wheezing, rales or  rhonchi Abdomen: soft nontender, nondistended, normal bowel sounds, no organomegaly Extremities: no cyanosis, clubbing or edema noted bilaterally Neuro: Cranial nerves II-XII intact, no focal neurological deficits  Data Reviewed: Basic Metabolic Panel:  Recent Labs Lab 12/05/13 1140  NA 141  K 3.8  CL 105  CO2 24  GLUCOSE 89  BUN 15  CREATININE 0.87  CALCIUM 9.6   Liver Function Tests: No results found for this basename: AST, ALT, ALKPHOS, BILITOT, PROT, ALBUMIN,  in the last 168 hours No results found for this basename: LIPASE, AMYLASE,  in the last 168 hours No results found for this basename: AMMONIA,  in the last 168 hours CBC:  Recent Labs Lab 12/05/13 1140  WBC 10.4  NEUTROABS 7.8*  HGB 13.0  HCT 37.1  MCV 89.6  PLT 211   Cardiac Enzymes:  Recent Labs Lab 12/05/13 1140 12/05/13 2001 12/06/13 0111 12/06/13 0815  TROPONINI <0.30 <0.30 <0.30 <0.30   BNP (last 3 results)  Recent Labs  12/05/13 1140  PROBNP 47.4   CBG: No results found for this basename: GLUCAP,  in the last 168 hours  Micro No results found for this or any previous visit (from the past 240 hour(s)).   Studies: Dg Chest 2 View  12/05/2013   CLINICAL DATA:  Chest pain  EXAM: CHEST  2 VIEW  COMPARISON:  None.  FINDINGS: The lungs are adequately inflated and clear. There is no focal infiltrate. There is no pleural effusion or pneumothorax. The cardiopericardial silhouette is normal in size. The pulmonary vascularity  is not engorged. The mediastinum is normal in width. There is no pleural effusion. The observed portions of the bony thorax appear normal.  IMPRESSION: There is no evidence of active cardiopulmonary disease. 1 cannot exclude acute bronchitis in the appropriate clinical setting.   Electronically Signed   By: David  [place]   On: 12/05/2013 11:40   Ct Angio Chest W/cm &/or Wo Cm  12/05/2013   CLINICAL DATA:  Chest pain  EXAM: CT ANGIOGRAPHY CHEST WITH CONTRAST  TECHNIQUE:  Multidetector CT imaging of the chest was performed using the standard protocol during bolus administration of intravenous contrast. Multiplanar CT image reconstructions and MIPs were obtained to evaluate the vascular anatomy.  CONTRAST:  OMNIPAQUE IOHEXOL 350 MG/ML SOLN  COMPARISON:  Chest CT angiogram January 21, 2010 and chest radiograph December 05, 2013  FINDINGS: There is no demonstrable pulmonary embolus. There is no thoracic aortic aneurysm or dissection.  On axial slice 55, series 5, there is a stable 3 mm nodular opacity in the superior segment of the right lower lobe. Elsewhere the lungs are clear. No edema or consolidation.  There is no appreciable thoracic adenopathy. The pericardium is not thickened.  Visualized upper abdominal structures appear normal. There are no blastic or lytic bone lesions.  Review of the MIP images confirms the above findings.  IMPRESSION: No demonstrable pulmonary embolus. No edema or consolidation. Stable 3 mm nodular opacity right lower lobe. Stability since 2011 is consistent with benign etiology.   Electronically Signed   By: Bretta Bang M.D.   On: 12/05/2013 13:18    Scheduled Meds: . aspirin EC  325 mg Oral Daily  . enoxaparin (LOVENOX) injection  60 mg Subcutaneous Q12H  . metoprolol tartrate  12.5 mg Oral BID  . sodium chloride  3 mL Intravenous Q12H  . topiramate  150 mg Oral QHS   Continuous Infusions: . sodium chloride         Time spent: 35 minutes    [facility] A  [facility] Pager [phone] If 7PM-7AM, please contact night-coverage at [email or web address], password [facility] 12/07/2013, 10:47 AM  LOS: 2 days

## 2013-12-07 NOTE — Progress Notes (Signed)
Subjective:  No chest pain. Son at bedside.  Objective:  Vital Signs in the last 24 hours: Temp:  [97.8 F (36.6 C)-98.6 F (37 C)] 97.8 F (36.6 C) (02/22 0600) Pulse Rate:  [56-79] 79 (02/22 1038) Resp:  [15-18] 15 (02/22 0600) BP: (119-131)/(64-67) 131/67 mmHg (02/22 0600) SpO2:  [97 %-100 %] 100 % (02/22 0600)  Intake/Output from previous day: 02/21 0701 - 02/22 0700 In: 240 [P.O.:240] Out: -    Physical Exam: General: Well developed, well nourished, in no acute distress. Head:  Normocephalic and atraumatic. Lungs: Clear to auscultation and percussion. Heart: Normal S1 and S2.  No murmur, rubs or gallops.  Abdomen: soft, non-tender, positive bowel sounds. Extremities: No clubbing or cyanosis. No edema. 2+ radial pulse Neurologic: Alert and oriented x 3.    Lab Results:  Recent Labs  12/05/13 1140  WBC 10.4  HGB 13.0  PLT 211    Recent Labs  12/05/13 1140  NA 141  K 3.8  CL 105  CO2 24  GLUCOSE 89  BUN 15  CREATININE 0.87    Recent Labs  12/06/13 0111 12/06/13 0815  TROPONINI <0.30 <0.30   Imaging: Ct Angio Chest W/cm &/or Wo Cm  12/05/2013   CLINICAL DATA:  Chest pain  EXAM: CT ANGIOGRAPHY CHEST WITH CONTRAST  TECHNIQUE: Multidetector CT imaging of the chest was performed using the standard protocol during bolus administration of intravenous contrast. Multiplanar CT image reconstructions and MIPs were obtained to evaluate the vascular anatomy.  CONTRAST:  OMNIPAQUE IOHEXOL 350 MG/ML SOLN  COMPARISON:  Chest CT angiogram January 21, 2010 and chest radiograph December 05, 2013  FINDINGS: There is no demonstrable pulmonary embolus. There is no thoracic aortic aneurysm or dissection.  On axial slice 55, series 5, there is a stable 3 mm nodular opacity in the superior segment of the right lower lobe. Elsewhere the lungs are clear. No edema or consolidation.  There is no appreciable thoracic adenopathy. The pericardium is not thickened.  Visualized  upper abdominal structures appear normal. There are no blastic or lytic bone lesions.  Review of the MIP images confirms the above findings.  IMPRESSION: No demonstrable pulmonary embolus. No edema or consolidation. Stable 3 mm nodular opacity right lower lobe. Stability since 2011 is consistent with benign etiology.   Electronically Signed   By: Bretta Bang M.D.   On: 12/05/2013 13:18   Personally viewed.   Telemetry: No adverse arrhythmias Personally viewed.   EKG:  Nonspecific ST changes  Cardiac Studies:  Echocardiogram 12/06/13-normal ejection fraction  Assessment/Plan:  Principal Problem:   Chest pain Active Problems:   History of pulmonary embolism   History of Bell's palsy   HTN (hypertension)   Unstable angina  48 year old female with strong early family history of CAD with return of severe chest discomfort, nonspecific ST-T wave changes on EKG with prior nuclear stress test 9/14 that was low risk with no ischemia.   1. Chest pain-Dr. Wyline Mood with cardiology saw her in consultation and sent her to [facility] hospital for further with heart catheterization. I discussed with her the risks and benefits of cardiac catheterization including stroke, heart attack, death, renal impairment, bleeding. Questions were answered. She is willing to proceed. This will be performed on Monday unless symptoms worsen or become more worrisome over the weekend. Discussion. Radial artery approach reasonable. 2. History of pulmonary embolism-no evidence of PE on CT.  3. Early family history of CAD-father had MI in his 75s.  We  will continue to monitor. Allow for diet. N.p.o. past midnight on Sunday.    Patricia Silva 12/07/2013, 12:22 PM

## 2013-12-08 ENCOUNTER — Encounter (HOSPITAL_COMMUNITY)
Admission: EM | Disposition: A | Discharge status: Home / Self Care | Payer: Self-pay | Source: Home / Self Care | Attending: Internal Medicine

## 2013-12-08 ENCOUNTER — Encounter: Payer: Self-pay | Admitting: Cardiology

## 2013-12-08 DIAGNOSIS — R079 Chest pain, unspecified: Principal | ICD-10-CM

## 2013-12-08 HISTORY — PX: LEFT HEART CATHETERIZATION WITH CORONARY ANGIOGRAM: SHX5451

## 2013-12-08 LAB — PROTIME-INR
INR: 0.88 (ref 0.00–1.49)
Prothrombin Time: 11.8 seconds (ref 11.6–15.2)

## 2013-12-08 SURGERY — LEFT HEART CATHETERIZATION WITH CORONARY ANGIOGRAM
Anesthesia: LOCAL

## 2013-12-08 MED ORDER — VERAPAMIL HCL 2.5 MG/ML IV SOLN
INTRAVENOUS | Status: AC
  Filled 2013-12-08: qty 2

## 2013-12-08 MED ORDER — SODIUM CHLORIDE 0.9 % IV SOLN: 1.0000 mL/kg/h | INTRAVENOUS | Status: AC

## 2013-12-08 MED ORDER — LIDOCAINE HCL (PF) 1 % IJ SOLN
INTRAMUSCULAR | Status: AC
  Filled 2013-12-08: qty 30

## 2013-12-08 MED ORDER — KETOROLAC TROMETHAMINE 30 MG/ML IJ SOLN
30.0000 mg | Freq: Once | INTRAMUSCULAR | Status: AC
  Administered 2013-12-08: 30 mg via INTRAVENOUS
  Filled 2013-12-08: qty 1

## 2013-12-08 MED ORDER — HEPARIN (PORCINE) IN NACL 2-0.9 UNIT/ML-% IJ SOLN
INTRAMUSCULAR | Status: AC
  Filled 2013-12-08: qty 500

## 2013-12-08 MED ORDER — DIAZEPAM 5 MG PO TABS: 5.0000 mg | ORAL_TABLET | ORAL | Status: DC

## 2013-12-08 MED ORDER — HEPARIN (PORCINE) IN NACL 2-0.9 UNIT/ML-% IJ SOLN
INTRAMUSCULAR | Status: AC
  Filled 2013-12-08: qty 1000

## 2013-12-08 MED ORDER — MIDAZOLAM HCL 2 MG/2ML IJ SOLN
INTRAMUSCULAR | Status: AC
Start: 2013-12-08 — End: 2013-12-08
  Filled 2013-12-08: qty 2

## 2013-12-08 MED ORDER — NITROGLYCERIN 0.2 MG/ML ON CALL CATH LAB
INTRAVENOUS | Status: AC
  Filled 2013-12-08: qty 1

## 2013-12-08 MED ORDER — FENTANYL CITRATE 0.05 MG/ML IJ SOLN
INTRAMUSCULAR | Status: AC
  Filled 2013-12-08: qty 2

## 2013-12-08 MED ORDER — PROCHLORPERAZINE EDISYLATE 5 MG/ML IJ SOLN
10.0000 mg | Freq: Once | INTRAMUSCULAR | Status: AC
  Administered 2013-12-08: 10 mg via INTRAVENOUS
  Filled 2013-12-08: qty 2

## 2013-12-08 MED ORDER — HEPARIN SODIUM (PORCINE) 1000 UNIT/ML IJ SOLN
INTRAMUSCULAR | Status: AC
  Filled 2013-12-08: qty 1

## 2013-12-08 NOTE — Interval H&P Note (Signed)
Cath Lab Visit (complete for each Cath Lab visit)  Clinical Evaluation Leading to the Procedure:   ACS: yes  Non-ACS:    Anginal Classification: CCS IV  Anti-ischemic medical therapy: Minimal Therapy (1 class of medications)  Non-Invasive Test Results: No non-invasive testing performed  Prior CABG: No previous CABG      History and Physical Interval Note:  12/08/2013 11:24 AM  Patricia Silva  has presented today for surgery, with the diagnosis of cp  The various methods of treatment have been discussed with the patient and family. After consideration of risks, benefits and other options for treatment, the patient has consented to  Procedure(s): LEFT HEART CATHETERIZATION WITH CORONARY ANGIOGRAM (N/A) as a surgical intervention .  The patient's history has been reviewed, patient examined, no change in status, stable for surgery.  I have reviewed the patient's chart and labs.  Questions were answered to the patient's satisfaction.     Vipul Cafarelli S.

## 2013-12-08 NOTE — H&P (View-Only) (Signed)
Subjective: Denies further chest pain. No SOB.   Objective: Vital signs in last 24 hours: Temp:  [97.7 F (36.5 C)-97.9 F (36.6 C)] 97.7 F (36.5 C) (02/23 0516) Pulse Rate:  [56-79] 56 (02/23 0516) Resp:  [15-18] 15 (02/23 0516) BP: (107-135)/(68-76) 107/68 mmHg (02/23 0516) SpO2:  [99 %-100 %] 100 % (02/23 0516) Last BM Date: 12/08/13  Intake/Output from previous day: 02/22 0701 - 02/23 0700 In: 345.6 [P.O.:240; I.V.:105.6] Out: -  Intake/Output this shift:    Medications Current Facility-Administered Medications  Medication Dose Route Frequency Provider Last Rate Last Dose  . 0.9 %  sodium chloride infusion   Intravenous Continuous Elease Etienne, MD      . 0.9 %  sodium chloride infusion  250 mL Intravenous PRN Donato Schultz, MD      . 0.9 %  sodium chloride infusion  1 mL/kg/hr Intravenous Continuous Donato Schultz, MD 57.6 mL/hr at 12/08/13 0410 1 mL/kg/hr at 12/08/13 0410  . acetaminophen (TYLENOL) tablet 650 mg  650 mg Oral Q6H PRN Elease Etienne, MD       Or  . acetaminophen (TYLENOL) suppository 650 mg  650 mg Rectal Q6H PRN Elease Etienne, MD      . albuterol (PROVENTIL) (2.5 MG/3ML) 0.083% nebulizer solution 2.5 mg  2.5 mg Nebulization Q2H PRN Elease Etienne, MD      . alum & mag hydroxide-simeth (MAALOX/MYLANTA) 200-200-20 MG/5ML suspension 30 mL  30 mL Oral Q6H PRN Elease Etienne, MD      . aspirin EC tablet 325 mg  325 mg Oral Daily Elease Etienne, MD   325 mg at 12/07/13 1038  . diazepam (VALIUM) tablet 5 mg  5 mg Oral On Call Donato Schultz, MD      . enoxaparin (LOVENOX) injection 60 mg  60 mg Subcutaneous Q12H Clydia Llano, MD   60 mg at 12/07/13 1705  . metoprolol tartrate (LOPRESSOR) tablet 12.5 mg  12.5 mg Oral BID Elease Etienne, MD   12.5 mg at 12/07/13 2124  . nitroGLYCERIN (NITROSTAT) SL tablet 0.4 mg  0.4 mg Sublingual Q5 Min x 3 PRN Shon Baton, MD   0.4 mg at 12/05/13 1228  . ondansetron (ZOFRAN) tablet 4 mg  4 mg Oral Q6H PRN  Elease Etienne, MD       Or  . ondansetron (ZOFRAN) injection 4 mg  4 mg Intravenous Q6H PRN Elease Etienne, MD      . sodium chloride 0.9 % injection 3 mL  3 mL Intravenous Q12H Elease Etienne, MD   3 mL at 12/08/13 0551  . sodium chloride 0.9 % injection 3 mL  3 mL Intravenous Q12H Donato Schultz, MD   3 mL at 12/08/13 0410  . sodium chloride 0.9 % injection 3 mL  3 mL Intravenous PRN Donato Schultz, MD      . topiramate (TOPAMAX) tablet 150 mg  150 mg Oral QHS Elease Etienne, MD   150 mg at 12/07/13 2123    PE: General appearance: alert, cooperative and no distress Lungs: clear to auscultation bilaterally Heart: regular rate and rhythm, S1, S2 normal, no murmur, click, rub or gallop Extremities: no LEE Pulses: 2+ and symmetric Skin: warm and dry Neurologic: Grossly normal  Lab Results:   Recent Labs  12/05/13 1140  WBC 10.4  HGB 13.0  HCT 37.1  PLT 211   BMET  Recent Labs  12/05/13 1140  NA 141  K  3.8  CL 105  CO2 24  GLUCOSE 89  BUN 15  CREATININE 0.87  CALCIUM 9.6   PT/INR No results found for this basename: LABPROT, INR,  in the last 72 hours Cholesterol  Recent Labs  12/06/13 0115  CHOL 130   Cardiac Panel (last 3 results)  Recent Labs  12/05/13 2001 12/06/13 0111 12/06/13 0815  TROPONINI <0.30 <0.30 <0.30    Assessment/Plan  Principal Problem:   Chest pain Active Problems:   History of pulmonary embolism   History of Bell's palsy   HTN (hypertension)   Unstable angina  Plan: Currently CP free. Enzymes negative x 3. Given strong family h/o early CAD, will plan for LHC with Dr. Eldridge DaceVaranasi later today. She has been NPO since midnight. HR and BP both stable. INR pending. If INR is less than 1.7, will proceed with cath.     LOS: 3 days    Brittainy M. Delmer IslamSimmons, PA-C 12/08/2013 8:34 AM  History and all data above reviewed.  Patient examined.  I agree with the findings as above. No chest pain The patient exam reveals COR:RRR  ,  Lungs:  Clear  ,  Abd: Positive bowel sounds, no rebound no guarding, Ext No edema  .  All available labs, radiology testing, previous records reviewed. Agree with documented assessment and plan. Cath today.  The patient understands that risks included but are not limited to stroke (1 in 1000), death (1 in 1000), kidney failure [usually temporary] (1 in 500), bleeding (1 in 200), allergic reaction [possibly serious] (1 in 200).  The patient understands and agrees to proceed. Home if OK.  Of not she is not on warfarin.   Fayrene FearingJames [facility]  9:45 AM  12/08/2013

## 2013-12-08 NOTE — Discharge Instructions (Addendum)
Radial Site Care °Refer to this sheet in the next few weeks. These instructions provide you with information on caring for yourself after your procedure. Your caregiver may also give you more specific instructions. Your treatment has been planned according to current medical practices, but problems sometimes occur. Call your caregiver if you have any problems or questions after your procedure. °HOME CARE INSTRUCTIONS °· You may shower the day after the procedure. Remove the bandage (dressing) and gently wash the site with plain soap and water. Gently pat the site dry. °· Do not apply powder or lotion to the site. °· Do not submerge the affected site in water for 3 to 5 days. °· Inspect the site at least twice daily. °· Do not flex or bend the affected arm for 24 hours. °· No lifting over 5 pounds (2.3 kg) for 5 days after your procedure. °· Do not drive home if you are discharged the same day of the procedure. Have someone else drive you. °· You may drive 24 hours after the procedure unless otherwise instructed by your caregiver. °· Do not operate machinery or power tools for 24 hours. °· A responsible adult should be with you for the first 24 hours after you arrive home. °What to expect: °· Any bruising will usually fade within 1 to 2 weeks. °· Blood that collects in the tissue (hematoma) may be painful to the touch. It should usually decrease in size and tenderness within 1 to 2 weeks. °SEEK IMMEDIATE MEDICAL CARE IF: °· You have unusual pain at the radial site. °· You have redness, warmth, swelling, or pain at the radial site. °· You have drainage (other than a small amount of blood on the dressing). °· You have chills. °· You have a fever or persistent symptoms for more than 72 hours. °· You have a fever and your symptoms suddenly get worse. °· Your arm becomes pale, cool, tingly, or numb. °· You have heavy bleeding from the site. Hold pressure on the site. °Document Released: 11/04/2010 Document Revised:  12/25/2011 Document Reviewed: 11/04/2010 °ExitCare® Patient Information ©2014 [employer]. ° ° °Cardiac Diet °This diet can help prevent heart disease and stroke. Many factors influence your heart health, including eating and exercise habits. Coronary risk rises a lot with abnormal blood fat (lipid) levels. Cardiac meal planning includes limiting unhealthy fats, increasing healthy fats, and making other small dietary changes. General guidelines are as follows: °· Adjust calorie intake to reach and maintain desirable body weight. °· Limit total fat intake to less than 30% of total calories. Saturated fat should be less than 7% of calories. °· Saturated fats are found in animal products and in some vegetable products. Saturated vegetable fats are found in coconut oil, cocoa butter, palm oil, and palm kernel oil. Read labels carefully to avoid these products as much as possible. Use butter in moderation. Choose tub margarines and oils that have 2 grams of fat or less. Good cooking oils are canola and olive oils. °· Practice low-fat cooking techniques. Do not fry food. Instead, broil, bake, boil, steam, grill, roast on a rack, stir-fry, or microwave it. Other fat reducing suggestions include: °· Remove the skin from poultry. °· Remove all visible fat from meats. °· Skim the fat off stews, soups, and gravies before serving them. °· Steam vegetables in water or broth instead of sautéing them in fat. °· Avoid foods with trans fat (or hydrogenated oils), such as commercially fried foods and commercially baked goods. Commercial shortening and deep-frying fats   will contain trans fat. °· Increase intake of fruits, vegetables, whole grains, and legumes to replace foods high in fat. °· Increase consumption of nuts, legumes, and seeds to at least 4 servings weekly. One serving of a legume equals ½ cup, and 1 serving of nuts or seeds equals ¼ cup. °· Choose whole grains more often. Have 3 servings per day (a serving is 1 ounce  [oz]). °· Eat 4 to 5 servings of vegetables per day. A serving of vegetables is 1 cup of raw leafy vegetables; ½ cup of raw or cooked cut-up vegetables; ½ cup of vegetable juice. °· Eat 4 to 5 servings of fruit per day. A serving of fruit is 1 medium whole fruit; ¼ cup of dried fruit; ½ cup of fresh, frozen, or canned fruit; ½ cup of 100% fruit juice. °· Increase your intake of dietary fiber to 20 to 30 grams per day. Insoluble fiber may help lower your risk of heart disease and may help curb your appetite.  °Soluble fiber binds cholesterol to be removed from the blood. Foods high in soluble fiber are dried beans, citrus fruits, oats, apples, bananas, broccoli, Brussels sprouts, and eggplant. °· Try to include foods fortified with plant sterols or stanols, such as yogurt, breads, juices, or margarines. Choose several fortified foods to achieve a daily intake of 2 to 3 grams of plant sterols or stanols. °· Foods with omega-3 fats can help reduce your risk of heart disease. Aim to have a 3.5 oz portion of fatty fish twice per week, such as salmon, mackerel, albacore tuna, sardines, lake trout, or herring. If you wish to take a fish oil supplement, choose one that contains 1 gram of both DHA and EPA. °· Limit processed meats to 2 servings (3 oz portion) weekly. °· Limit the sodium in your diet to 1500 milligrams (mg) per day. If you have high blood pressure, talk to a registered dietitian about a DASH (Dietary Approaches to Stop Hypertension) eating plan. °· Limit sweets and beverages with added sugar, such as soda, to no more than 5 servings per week. One serving is:   °· 1 tablespoon sugar. °· 1 tablespoon jelly or jam. °· ½ cup sorbet. °· 1 cup lemonade. °· ½ cup regular soda. °CHOOSING FOODS °Starches °· Allowed: Breads: All kinds (wheat, rye, raisin, white, oatmeal, Italian, French, and English muffin bread). Low-fat rolls: English muffins, frankfurter and hamburger buns, bagels, pita bread, tortillas (not fried).  Pancakes, waffles, biscuits, and muffins made with recommended oil. °· Avoid: Products made with saturated or trans fats, oils, or whole milk products. Butter rolls, cheese breads, croissants. Commercial doughnuts, muffins, sweet rolls, biscuits, waffles, pancakes, store-bought mixes. °Crackers °· Allowed: Low-fat crackers and snacks: Animal, graham, rye, saltine (with recommended oil, no lard), oyster, and matzo crackers. Bread sticks, melba toast, rusks, flatbread, pretzels, and light popcorn. °· Avoid: High-fat crackers: cheese crackers, butter crackers, and those made with coconut, palm oil, or trans fat (hydrogenated oils). Buttered popcorn. °Cereals °· Allowed: Hot or cold whole-grain cereals. °· Avoid: Cereals containing coconut, hydrogenated vegetable fat, or animal fat. °Potatoes / Pasta / Rice °· Allowed: All kinds of potatoes, rice, and pasta (such as macaroni, spaghetti, and noodles). °· Avoid: Pasta or rice prepared with cream sauce or high-fat cheese. Chow mein noodles, French fries. °Vegetables °· Allowed: All vegetables and vegetable juices. °· Avoid: Fried vegetables. Vegetables in cream, butter, or high-fat cheese sauces. Limit coconut. Fruit in cream or custard. °Protein °· Allowed: Limit your intake of   meat, seafood, and poultry to no more than 6 oz (cooked weight) per day. All lean, well-trimmed beef, veal, pork, and lamb. All chicken and turkey without skin. All fish and shellfish. Wild game: wild duck, rabbit, pheasant, and venison. Egg whites or low-cholesterol egg substitutes may be used as desired. Meatless dishes: recipes with dried beans, peas, lentils, and tofu (soybean curd). Seeds and nuts: all seeds and most nuts. °· Avoid: Prime grade and other heavily marbled and fatty meats, such as short ribs, spare ribs, rib eye roast or steak, frankfurters, sausage, bacon, and high-fat luncheon meats, mutton. Caviar. Commercially fried fish. Domestic duck, goose, venison sausage. Organ meats:  liver, gizzard, heart, chitterlings, brains, kidney, sweetbreads. °Dairy °· Allowed: Low-fat cheeses: nonfat or low-fat cottage cheese (1% or 2% fat), cheeses made with part skim milk, such as mozzarella, farmers, string, or ricotta. (Cheeses should be labeled no more than 2 to 6 grams fat per oz.). Skim (or 1%) milk: liquid, powdered, or evaporated. Buttermilk made with low-fat milk. Drinks made with skim or low-fat milk or cocoa. Chocolate milk or cocoa made with skim or low-fat (1%) milk. Nonfat or low-fat yogurt. °· Avoid: Whole milk cheeses, including colby, cheddar, muenster, Monterey Jack, Havarti, Brie, Camembert, American, Swiss, and blue. Creamed cottage cheese, cream cheese. Whole milk and whole milk products, including buttermilk or yogurt made from whole milk, drinks made from whole milk. Condensed milk, evaporated whole milk, and 2% milk. °Soups and Combination Foods °· Allowed: Low-fat low-sodium soups: broth, dehydrated soups, homemade broth, soups with the fat removed, homemade cream soups made with skim or low-fat milk. Low-fat spaghetti, lasagna, chili, and Spanish rice if low-fat ingredients and low-fat cooking techniques are used. °· Avoid: Cream soups made with whole milk, cream, or high-fat cheese. All other soups. °Desserts and Sweets °· Allowed: Sherbet, fruit ices, gelatins, meringues, and angel food cake. Homemade desserts with recommended fats, oils, and milk products. Jam, jelly, honey, marmalade, sugars, and syrups. Pure sugar candy, such as gum drops, hard candy, jelly beans, marshmallows, mints, and small amounts of dark chocolate. °· Avoid: Commercially prepared cakes, pies, cookies, frosting, pudding, or mixes for these products. Desserts containing whole milk products, chocolate, coconut, lard, palm oil, or palm kernel oil. Ice cream or ice cream drinks. Candy that contains chocolate, coconut, butter, hydrogenated fat, or unknown ingredients. Buttered syrups. °Fats and  Oils °· Allowed: Vegetable oils: safflower, sunflower, corn, soybean, cottonseed, sesame, canola, olive, or peanut. Non-hydrogenated margarines. Salad dressing or mayonnaise: homemade or commercial, made with a recommended oil. Low or nonfat salad dressing or mayonnaise. °· Limit added fats and oils to 6 to 8 tsp per day (includes fats used in cooking, baking, salads, and spreads on bread). Remember to count the "hidden fats" in foods. °· Avoid: Solid fats and shortenings: butter, lard, salt pork, bacon drippings. Gravy containing meat fat, shortening, or suet. Cocoa butter, coconut. Coconut oil, palm oil, palm kernel oil, or hydrogenated oils: these ingredients are often used in bakery products, nondairy creamers, whipped toppings, candy, and commercially fried foods. Read labels carefully. Salad dressings made of unknown oils, sour cream, or cheese, such as blue cheese and Roquefort. Cream, all kinds: half-and-half, light, heavy, or whipping. Sour cream or cream cheese (even if "light" or low-fat). Nondairy cream substitutes: coffee creamers and sour cream substitutes made with palm, palm kernel, hydrogenated oils, or coconut oil. °Beverages °· Allowed: Coffee (regular or decaffeinated), tea. Diet carbonated beverages, mineral water. Alcohol: Check with your caregiver. Moderation is recommended. °·   Avoid: Whole milk, regular sodas, and juice drinks with added sugar. °Condiments °· Allowed: All seasonings and condiments. Cocoa powder. "Cream" sauces made with recommended ingredients. °· Avoid: Carob powder made with hydrogenated fats. °SAMPLE MENU °Breakfast °· ½ cup orange juice °· ½ cup oatmeal °· 1 slice toast °· 1 tsp margarine °· 1 cup skim milk °Lunch °· Turkey sandwich with 2 oz turkey, 2 slices bread °· Lettuce and tomato slices °· Fresh fruit °· Carrot sticks °· Coffee or tea °Snack °· Fresh fruit or low-fat crackers °Dinner °· 3 oz lean ground beef °· 1 baked potato °· 1 tsp margarine °· ½ cup  asparagus °· Lettuce salad °· 1 tbs non-creamy dressing °· ½ cup peach slices °· 1 cup skim milk °Document Released: 07/11/2008 Document Revised: 04/02/2012 Document Reviewed: 12/26/2011 °Exit[employer]® Patient Information ©2014 [employer]. ° °

## 2013-12-08 NOTE — CV Procedure (Signed)
       PROCEDURE:  Left heart catheterization with selective coronary angiography.  INDICATIONS:    The risks, benefits, and details of the procedure were explained to the patient.  The patient verbalized understanding and wanted to proceed.  Informed written consent was obtained.  PROCEDURE TECHNIQUE:  After Xylocaine anesthesia a 4F slender sheath was placed in the right radial artery with a single anterior needle wall stick. IV heparin was given . Right coronary angiography was done using a Judkins R4 guide catheter.  Left coronary angiography was done using a Judkins L3.5 guide catheter which was selective into the circumflex. A JL 3.0 catheter was used to then obtain pictures of the LAD.  Left heart catheterization was done using a pigtail catheter.  A TR band was used for hemostasis.   CONTRAST:  Total of 40 cc.  COMPLICATIONS:  None.    HEMODYNAMICS:  Aortic pressure was 139/75; LV pressure was 141/3; LVEDP 8.  There was no gradient between the left ventricle and aorta.    ANGIOGRAPHIC DATA:   The left main coronary artery appears absent.  There appears to be separate ostia of the LAD and circumflex.  The left anterior descending artery is a large vessel which appears angiographic normal. The first diagonal is large and widely patent. There are 2 small diagonals which were also patent.  The left circumflex artery is a large vessel proximally. There is a large OM 1 which extends across the lateral wall. There is no significant atherosclerosis.   The right coronary artery is a large dominant vessel which appears widely patent. The posterior descending artery is large and widely patent. Posterior lateral artery is large and widely patent.  LEFT VENTRICULOGRAM:  Left ventricular angiogram was not done. LVEDP was 8 mmHg.  IMPRESSIONS:  1. 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  There appear to be separate ostia of the LAD and circumflex 2. Normal left anterior descending artery and  its branches. 3. Normal left circumflex artery and its branches. 4. Normal right coronary artery. 5. Normal left ventricular systolic function by recent echocardiogram. Ventriculogram was not performed.  LVEDP 8 mmHg.    RECOMMENDATION:  No significant coronary artery disease. Continue aggressive preventive therapy..Marland Kitchen

## 2013-12-08 NOTE — Progress Notes (Signed)
TR band deflated and removed per protocol. 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 Right radial site level 0. Gauze and tegaderm applied. VSS. Pt without complaints.

## 2013-12-08 NOTE — Progress Notes (Signed)
Was called by rapid response regarding a possible neuro change on the floor. Patient with a history of migraines who has had spots in her vision with previous migraines who had spots again today that lasted about 15 minutes. These resolved over about 15 minutes and now she complains of a mild left sided headache with photophobia.   She is awake, alert oriented. Intact sensation and strength. She has normal FNF bilaterally and intact visual fields. Left facial droop is from previous bell's palsy.   Given the history of similar phenomena with migraines and current unilateral headche with photophobia, I feel that this represents a migraine and do not feel that stroke alert or further workup such as MRI is necessary.   Would favor giving migraine abortive such as toradol 30mg  IV x 1 or compazine 10mg  IV x 1, but will defer to primary team.   Please call with any further questions or concerns.   Ritta SlotMcNeill Trevin Gartrell, MD [facility] [phone]  If 7pm- 7am, please page neurology on call at [phone].

## 2013-12-08 NOTE — Progress Notes (Signed)
Went to pt's room to discharge her, pt stated she had been "seeing spots, kind of like floaters but more pronounced" out of her right eye only. Pt stated this happens at times prior to her getting a migraine. Neuro intact, no other deficits. Dr. Arthor CaptainElmahi paged and made aware. Order received for STAT MRI. Neuro up to see pt, stated it appears its related to her migraines and after speaking with Dr. Arthor CaptainElmahi, ok to cancel MRI. Pt starting to get a slight headache now, waiting for order to give med per neurology. Will continue to monitor closely.

## 2013-12-08 NOTE — Progress Notes (Signed)
Subjective: Denies further chest pain. No SOB.   Objective: Vital signs in last 24 hours: Temp:  [97.7 F (36.5 C)-97.9 F (36.6 C)] 97.7 F (36.5 C) (02/23 0516) Pulse Rate:  [56-79] 56 (02/23 0516) Resp:  [15-18] 15 (02/23 0516) BP: (107-135)/(68-76) 107/68 mmHg (02/23 0516) SpO2:  [99 %-100 %] 100 % (02/23 0516) Last BM Date: 12/08/13  Intake/Output from previous day: 02/22 0701 - 02/23 0700 In: 345.6 [P.O.:240; I.V.:105.6] Out: -  Intake/Output this shift:    Medications Current Facility-Administered Medications  Medication Dose Route Frequency Provider Last Rate Last Dose  . 0.9 %  sodium chloride infusion   Intravenous Continuous Elease Etienne, MD      . 0.9 %  sodium chloride infusion  250 mL Intravenous PRN Donato Schultz, MD      . 0.9 %  sodium chloride infusion  1 mL/kg/hr Intravenous Continuous Donato Schultz, MD 57.6 mL/hr at 12/08/13 0410 1 mL/kg/hr at 12/08/13 0410  . acetaminophen (TYLENOL) tablet 650 mg  650 mg Oral Q6H PRN Elease Etienne, MD       Or  . acetaminophen (TYLENOL) suppository 650 mg  650 mg Rectal Q6H PRN Elease Etienne, MD      . albuterol (PROVENTIL) (2.5 MG/3ML) 0.083% nebulizer solution 2.5 mg  2.5 mg Nebulization Q2H PRN Elease Etienne, MD      . alum & mag hydroxide-simeth (MAALOX/MYLANTA) 200-200-20 MG/5ML suspension 30 mL  30 mL Oral Q6H PRN Elease Etienne, MD      . aspirin EC tablet 325 mg  325 mg Oral Daily Elease Etienne, MD   325 mg at 12/07/13 1038  . diazepam (VALIUM) tablet 5 mg  5 mg Oral On Call Donato Schultz, MD      . enoxaparin (LOVENOX) injection 60 mg  60 mg Subcutaneous Q12H Clydia Llano, MD   60 mg at 12/07/13 1705  . metoprolol tartrate (LOPRESSOR) tablet 12.5 mg  12.5 mg Oral BID Elease Etienne, MD   12.5 mg at 12/07/13 2124  . nitroGLYCERIN (NITROSTAT) SL tablet 0.4 mg  0.4 mg Sublingual Q5 Min x 3 PRN Shon Baton, MD   0.4 mg at 12/05/13 1228  . ondansetron (ZOFRAN) tablet 4 mg  4 mg Oral Q6H PRN  Elease Etienne, MD       Or  . ondansetron (ZOFRAN) injection 4 mg  4 mg Intravenous Q6H PRN Elease Etienne, MD      . sodium chloride 0.9 % injection 3 mL  3 mL Intravenous Q12H Elease Etienne, MD   3 mL at 12/08/13 0551  . sodium chloride 0.9 % injection 3 mL  3 mL Intravenous Q12H Donato Schultz, MD   3 mL at 12/08/13 0410  . sodium chloride 0.9 % injection 3 mL  3 mL Intravenous PRN Donato Schultz, MD      . topiramate (TOPAMAX) tablet 150 mg  150 mg Oral QHS Elease Etienne, MD   150 mg at 12/07/13 2123    PE: General appearance: alert, cooperative and no distress Lungs: clear to auscultation bilaterally Heart: regular rate and rhythm, S1, S2 normal, no murmur, click, rub or gallop Extremities: no LEE Pulses: 2+ and symmetric Skin: warm and dry Neurologic: Grossly normal  Lab Results:   Recent Labs  12/05/13 1140  WBC 10.4  HGB 13.0  HCT 37.1  PLT 211   BMET  Recent Labs  12/05/13 1140  NA 141  K  3.8  CL 105  CO2 24  GLUCOSE 89  BUN 15  CREATININE 0.87  CALCIUM 9.6   PT/INR No results found for this basename: LABPROT, INR,  in the last 72 hours Cholesterol  Recent Labs  12/06/13 0115  CHOL 130   Cardiac Panel (last 3 results)  Recent Labs  12/05/13 2001 12/06/13 0111 12/06/13 0815  TROPONINI <0.30 <0.30 <0.30    Assessment/Plan  Principal Problem:   Chest pain Active Problems:   History of pulmonary embolism   History of Bell's palsy   HTN (hypertension)   Unstable angina  Plan: Currently CP free. Enzymes negative x 3. Given strong family h/o early CAD, will plan for LHC with Dr. Eldridge DaceVaranasi later today. She has been NPO since midnight. HR and BP both stable. INR pending. If INR is less than 1.7, will proceed with cath.     LOS: 3 days    Brittainy M. Delmer IslamSimmons, PA-C 12/08/2013 8:34 AM  History and all data above reviewed.  Patient examined.  I agree with the findings as above. No chest pain The patient exam reveals COR:RRR  ,  Lungs:  Clear  ,  Abd: Positive bowel sounds, no rebound no guarding, Ext No edema  .  All available labs, radiology testing, previous records reviewed. Agree with documented assessment and plan. Cath today.  The patient understands that risks included but are not limited to stroke (1 in 1000), death (1 in 1000), kidney failure [usually temporary] (1 in 500), bleeding (1 in 200), allergic reaction [possibly serious] (1 in 200).  The patient understands and agrees to proceed. Home if OK.  Of not she is not on warfarin.   Fayrene FearingJames [facility]  9:45 AM  12/08/2013

## 2013-12-08 NOTE — Discharge Summary (Addendum)
Physician Discharge Summary  Patricia Silva ZDG:[id number] DOB: 1966-06-17 DOA: 12/05/2013  PCP: Ignatius Specking., MD  Admit date: 12/05/2013 Discharge date: 12/08/2013  Time spent: 40 minutes  Recommendations for Outpatient Follow-up:  1. Followup with primary care physician within one week.  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 The patient states that she was in her usual state of health when she went to work this morning. While at work, she was sitting at her desk when she experienced sudden onset of lower substernal crushing type of chest pain, 10/10 in severity which she initially felt was gas-related pain and took some TUMS without relief. Subsequently this pain radiated to the left shoulder, was associated with dyspnea and diaphoresis. She then had worsening of chest pain with deep inspiration. EMS was called and on route to the ED, she received a dose of sublingual nitroglycerin with prompt relief of chest pain-pain lasted about 45 minutes in total. Workup in the ED revealed troponin x1 negative, EKG without acute changes and CT chest without PE. She had couple of episodes of chest pain again in the ED which were relieved by sublingual nitroglycerin. Currently she is chest pain-free but has mild  headache from NTG. She states that this pain is not similar to her GERD pain. Cardiology has evaluated patient in the ED and recommend transfer to [facility] for further evaluation. Hospitalist admission requested.   Hospital Course:   1. Chest pain: Patient presented initially to a pain hospital [place] in [place], [place]. Pain characteristics was concerning for unstable angina, cardiology was consulted over there and they recommended to transfer down to [facility] for cardiac catheterization. CT angiography was done and ruled out PE. Patient had -3 sets of cardiac enzymes, even though she was started on metoprolol, anticoagulation with Lovenox and aspirin/Plavix. Cardiac catheterization was done earlier today showed clean coronaries. It as mentioned above CT angiography did not show acute abnormalities this could be secondary to gastroenterological abnormality.  2. History of PE: CT angiography was done on admission showed no acute findings. There is 3 mm right lower lobe nodule, stable compared to the prior imaging. Outpatient followup is recommended.  3. GERD: Patient has history of GERD and remote history of esophageal stricture status post dilatation. Currently no active symptoms, said the chest pain was not similar to the previous GERD burning sensation, she is on TUMS as needed no PPI. This can be considered as outpatient if she continues to have symptoms PPI can add by primary care physician.  4. Migraine: Prior to discharge patient developed some visual changes as "spots" in her right eye, MRI ordered. Neurology evaluated the patient as part of rapid response team and this felt to be very typical to her previous migraine aura, and less likely  to represent a late embolic phenomena. symptoms resolved in about 15 minutes and patient started to have mild left sided headache consistent with migraine. MRI cancelled and abortive migraine therapy of Toradol and compazine given  according to neurology recommendation prior to the discharge.  Procedures:  Cardiac catheterization done on 12/08/13 by Dr. Eldridge DaceVaranasi showed: IMPRESSIONS:  1. 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 There appear to be separate ostia of the LAD and circumflex 2. Normal left anterior descending artery and its branches. 3. Normal left circumflex artery and its branches. 4. Normal right coronary artery. 5. Normal left ventricular systolic function by recent echocardiogram. Ventriculogram was not performed. LVEDP 8 mmHg.  Consultations:  Cardiology  Discharge Exam: Filed Vitals:   12/08/13 1218  BP: 117/50  Pulse: 55  Temp: 98.1 F (36.7 C)  Resp: 18   General: Alert and awake, oriented x3, not in any acute distress. HEENT: anicteric sclera, pupils reactive to light and accommodation, EOMI CVS: S1-S2 clear, no murmur rubs or gallops Chest: clear to auscultation bilaterally, no wheezing, rales or rhonchi Abdomen: soft nontender, nondistended, normal bowel sounds, no organomegaly Extremities: no cyanosis, clubbing or edema noted bilaterally Neuro: Cranial nerves II-XII intact, no focal neurological deficits  Discharge Instructions     Medication List    STOP taking these medications       amoxicillin-clavulanate 875-125 MG per tablet  Commonly known as:  AUGMENTIN      TAKE these medications       aspirin EC 81 MG tablet  Take 162 mg by mouth at bedtime.     tiZANidine 4 MG tablet  Commonly known as:  ZANAFLEX  Two tabs at onset of headache and two more if needed     topiramate 100 MG tablet  Commonly known as:  TOPAMAX  One and half tabs at bedtime       Allergies  Allergen Reactions  . Imitrex [Sumatriptan Base] Anaphylaxis and Other (See Comments)    Patient states "heart beats really fast and throat itches and my throat feels like it gets tight"  . Morphine And Related        Follow-up Information   Follow up with VYAS,DHRUV B., MD In 1 week.    Specialty:  Internal Medicine   Contact information:   [address] [place] [id number] [phone]        The results of significant diagnostics from this hospitalization (including imaging, microbiology, ancillary and laboratory) are listed below for reference.    Significant Diagnostic Studies: Dg Chest 2 View  12/05/2013   CLINICAL DATA:  Chest pain  EXAM: CHEST  2 VIEW  COMPARISON:  None.  FINDINGS: The lungs are adequately inflated and clear. There is no focal infiltrate. There is no pleural effusion or pneumothorax. The cardiopericardial silhouette is normal in size. The pulmonary vascularity is not engorged. The mediastinum is normal in width. There is no pleural effusion. The observed portions of the bony thorax appear normal.  IMPRESSION: There is no evidence of active cardiopulmonary disease. 1 cannot exclude acute bronchitis in the appropriate clinical setting.   Electronically Signed   By: David  Swazil[place]Jordan   On: 12/05/2013 11:40   Ct Angio Chest W/cm &/or Wo Cm  12/05/2013   CLINICAL DATA:  Chest pain  EXAM: CT ANGIOGRAPHY CHEST WITH CONTRAST  TECHNIQUE: Multidetector CT imaging of the chest was performed using the standard protocol during bolus administration of intravenous contrast. Multiplanar CT image reconstructions and MIPs were obtained to  evaluate the vascular anatomy.  CONTRAST:  OMNIPAQUE IOHEXOL 350 MG/ML SOLN  COMPARISON:  Chest CT angiogram January 21, 2010 and chest radiograph December 05, 2013  FINDINGS: There is no demonstrable pulmonary embolus. There is no thoracic aortic aneurysm or dissection.  On axial slice 55, series 5, there is a stable 3 mm nodular opacity in the superior segment of the right lower lobe. Elsewhere the lungs are clear. No edema or consolidation.  There is no appreciable thoracic adenopathy. The pericardium is not thickened.  Visualized upper abdominal structures appear normal. There are no blastic or lytic bone lesions.  Review of the MIP images  confirms the above findings.  IMPRESSION: No demonstrable pulmonary embolus. No edema or consolidation. Stable 3 mm nodular opacity right lower lobe. Stability since 2011 is consistent with benign etiology.   Electronically Signed   By: Bretta Bang M.D.   On: 12/05/2013 13:18    Microbiology: No results found for this or any previous visit (from the past 240 hour(s)).   Labs: Basic Metabolic Panel:  Recent Labs Lab 12/05/13 1140  NA 141  K 3.8  CL 105  CO2 24  GLUCOSE 89  BUN 15  CREATININE 0.87  CALCIUM 9.6   Liver Function Tests: No results found for this basename: AST, ALT, ALKPHOS, BILITOT, PROT, ALBUMIN,  in the last 168 hours No results found for this basename: LIPASE, AMYLASE,  in the last 168 hours No results found for this basename: AMMONIA,  in the last 168 hours CBC:  Recent Labs Lab 12/05/13 1140  WBC 10.4  NEUTROABS 7.8*  HGB 13.0  HCT 37.1  MCV 89.6  PLT 211   Cardiac Enzymes:  Recent Labs Lab 12/05/13 1140 12/05/13 2001 12/06/13 0111 12/06/13 0815  TROPONINI <0.30 <0.30 <0.30 <0.30   BNP: BNP (last 3 results)  Recent Labs  12/05/13 1140  PROBNP 47.4   CBG: No results found for this basename: GLUCAP,  in the last 168 hours     Signed:  Atalya Dano A  [facility] 12/08/2013, 12:46 PM

## 2013-12-09 NOTE — Progress Notes (Signed)
Subjective: Denies any headache, nausea chest pain.  Objective: Filed Vitals:   12/09/13 0546  BP: 104/61  Pulse: 54  Temp: 97.7 F (36.5 C)  Resp: 18  General: Alert and awake, oriented x3, not in any acute distress. HEENT: anicteric sclera, pupils reactive to light and accommodation, EOMI CVS: S1-S2 clear, no murmur rubs or gallops Chest: clear to auscultation bilaterally, no wheezing, rales or rhonchi Abdomen: soft nontender, nondistended, normal bowel sounds, no organomegaly Extremities: no cyanosis, clubbing or edema noted bilaterally Neuro: Cranial nerves II-XII intact, no focal neurological deficits  Assessment and plan: Chest pain History of PE GERD Migraine  See discharge summary dictated yesterday for full details. Patient was about to be discharged yesterday but she developed visual abnormalities, likely secondary to migraines. Kept overnight, no headache today patient to be discharged follow up with primary care physician.  Clint LippsMutaz A Landyn Lorincz Pager: [phone]: [phone] 12/09/2013, 10:18 AM

## 2014-09-24 ENCOUNTER — Encounter (HOSPITAL_COMMUNITY): Payer: Self-pay | Admitting: Interventional Cardiology

## 2015-07-28 IMAGING — CT CT ANGIO CHEST
1 of 6 series · 5 of 36 positions shown · IV contrast (omnipaque)
Comparison: Chest CT angiogram January 21, 2010 and chest radiograph
December 05, 2013

CLINICAL DATA: Chest pain

EXAM:
CT ANGIOGRAPHY CHEST WITH CONTRAST
TECHNIQUE: Multidetector CT imaging of the chest was performed using the
standard protocol during bolus administration of intravenous
contrast. Multiplanar CT image reconstructions and MIPs were
obtained to evaluate the vascular anatomy.
CONTRAST:  100mL OMNIPAQUE IOHEXOL 350 MG/ML SOLN

[Series 4: pe 3.0 b40f · axial · 0.59mm/px · z∈[-234,-54]mm · 5 of 90 slices shown]
[im 15/90  lung]
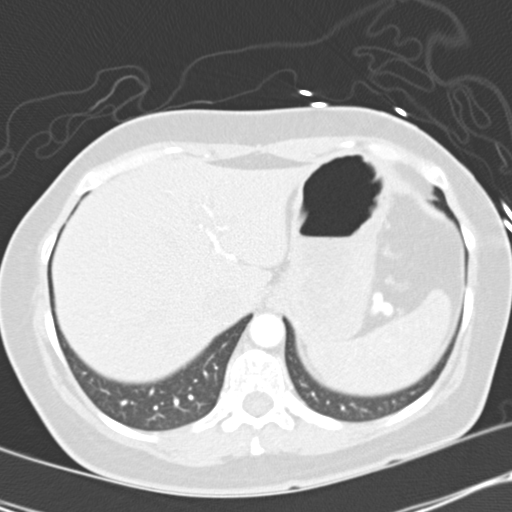
[im 30/90  mediastinal]
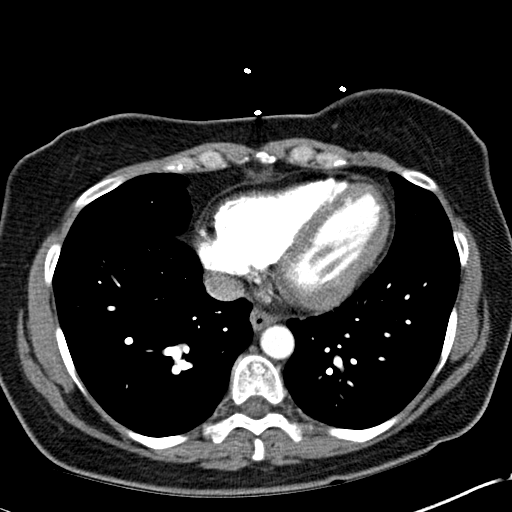
[im 45/90  lung]
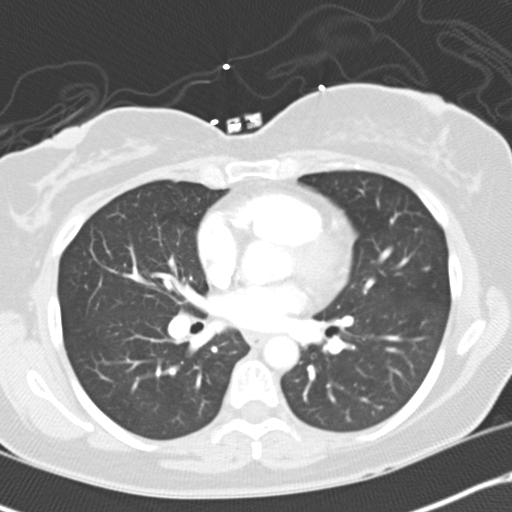
[im 60/90  mediastinal]
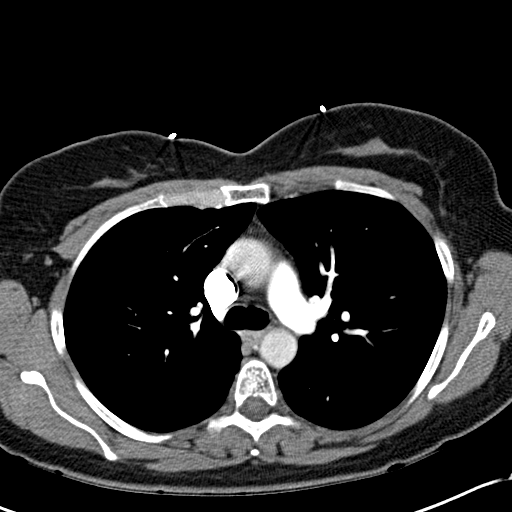
[im 75/90  lung]
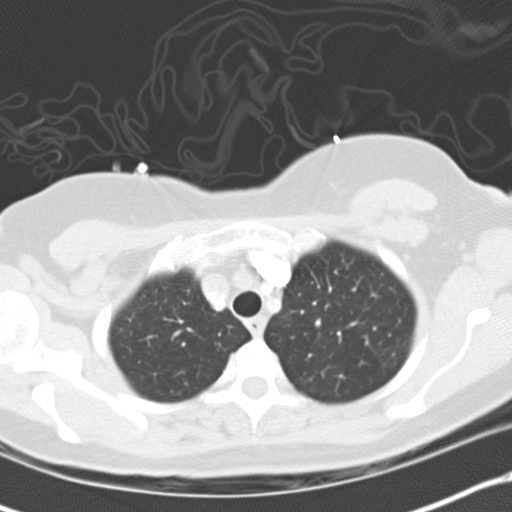

[5 of 36 positions shown; findings below may reference images not displayed]

FINDINGS: There is no demonstrable pulmonary embolus. There is no thoracic
aortic aneurysm or dissection.

On axial slice 55, series 5, there is a stable 3 mm nodular opacity
in the superior segment of the right lower lobe. Elsewhere the lungs
are clear. No edema or consolidation.

There is no appreciable thoracic adenopathy. The pericardium is not
thickened.

Visualized upper abdominal structures appear normal. There are no
blastic or lytic bone lesions.

Review of the MIP images confirms the above findings.
IMPRESSION: No demonstrable pulmonary embolus. No edema or consolidation. Stable
3 mm nodular opacity right lower lobe. Stability since 9811 is
consistent with benign etiology.

## 2015-07-28 IMAGING — CR DG CHEST 2V
2 series · 2 of 2 positions shown · non-contrast
Comparison: None.

CLINICAL DATA: Chest pain

EXAM:
CHEST  2 VIEW

[view not recorded (1 of 2)]
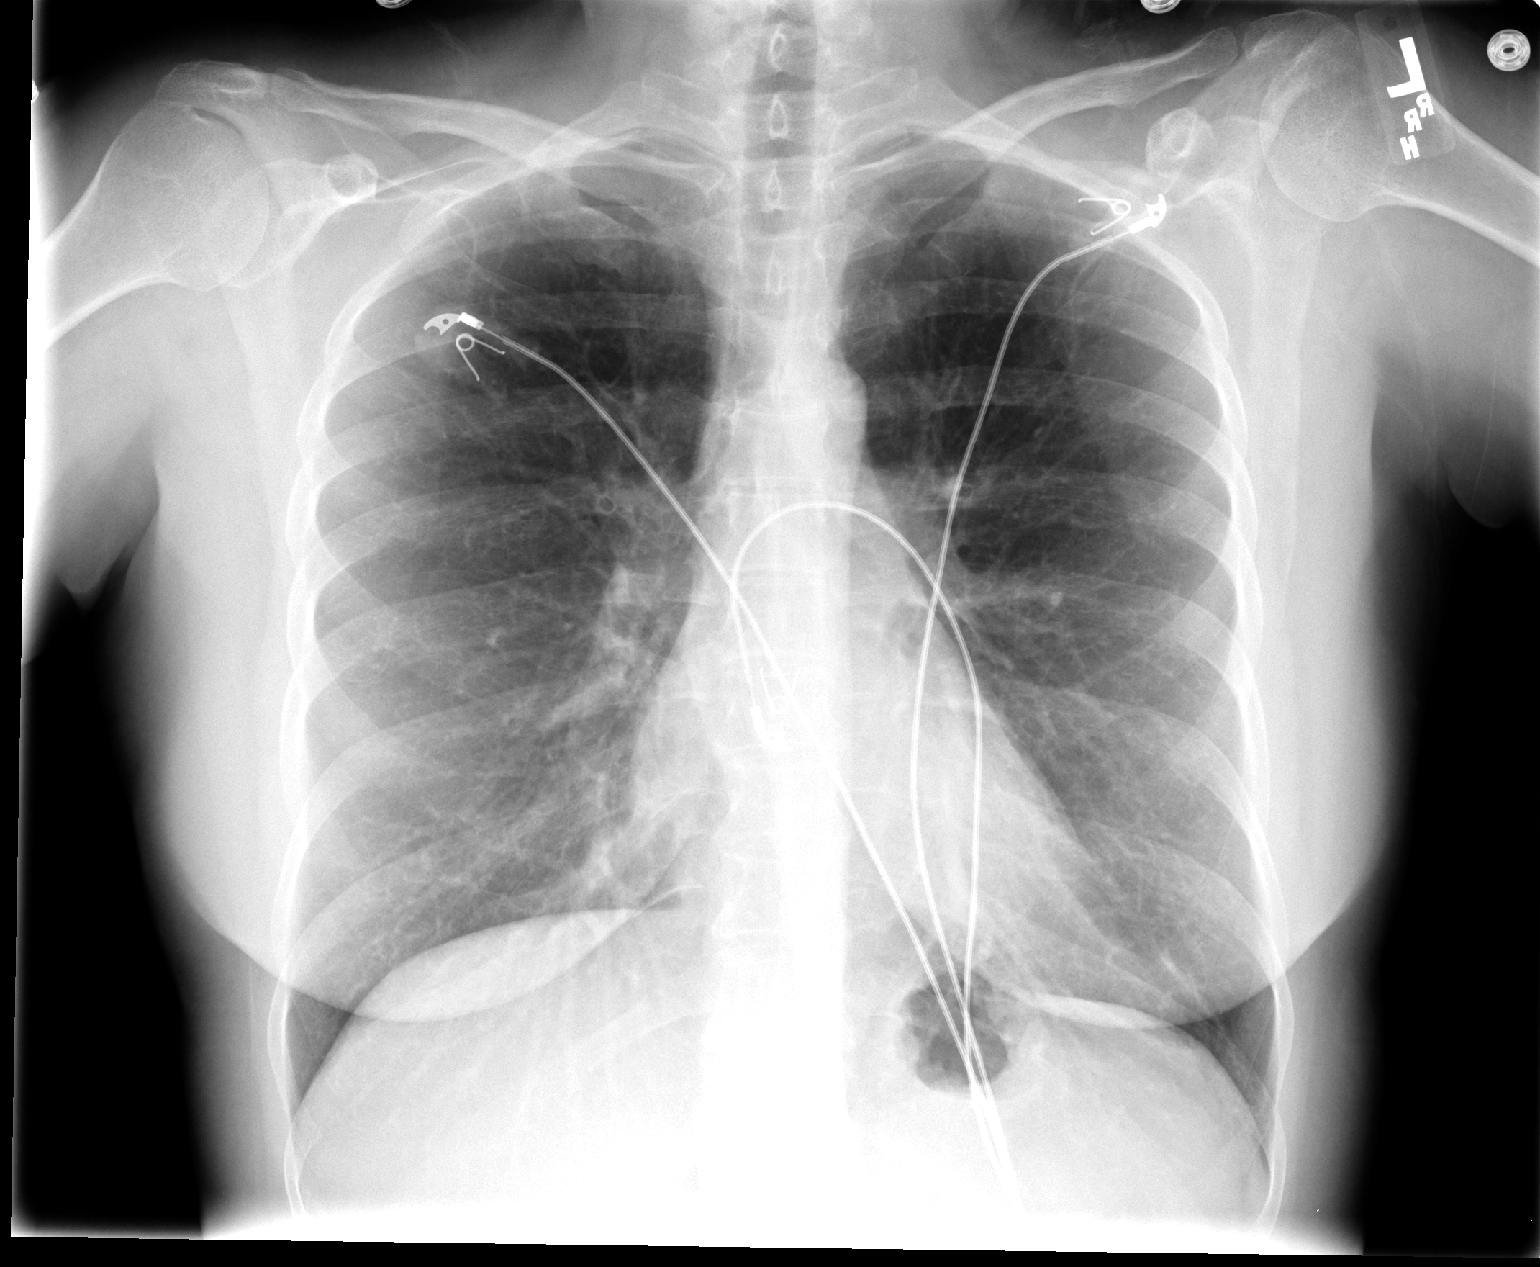

[view not recorded (2 of 2)]
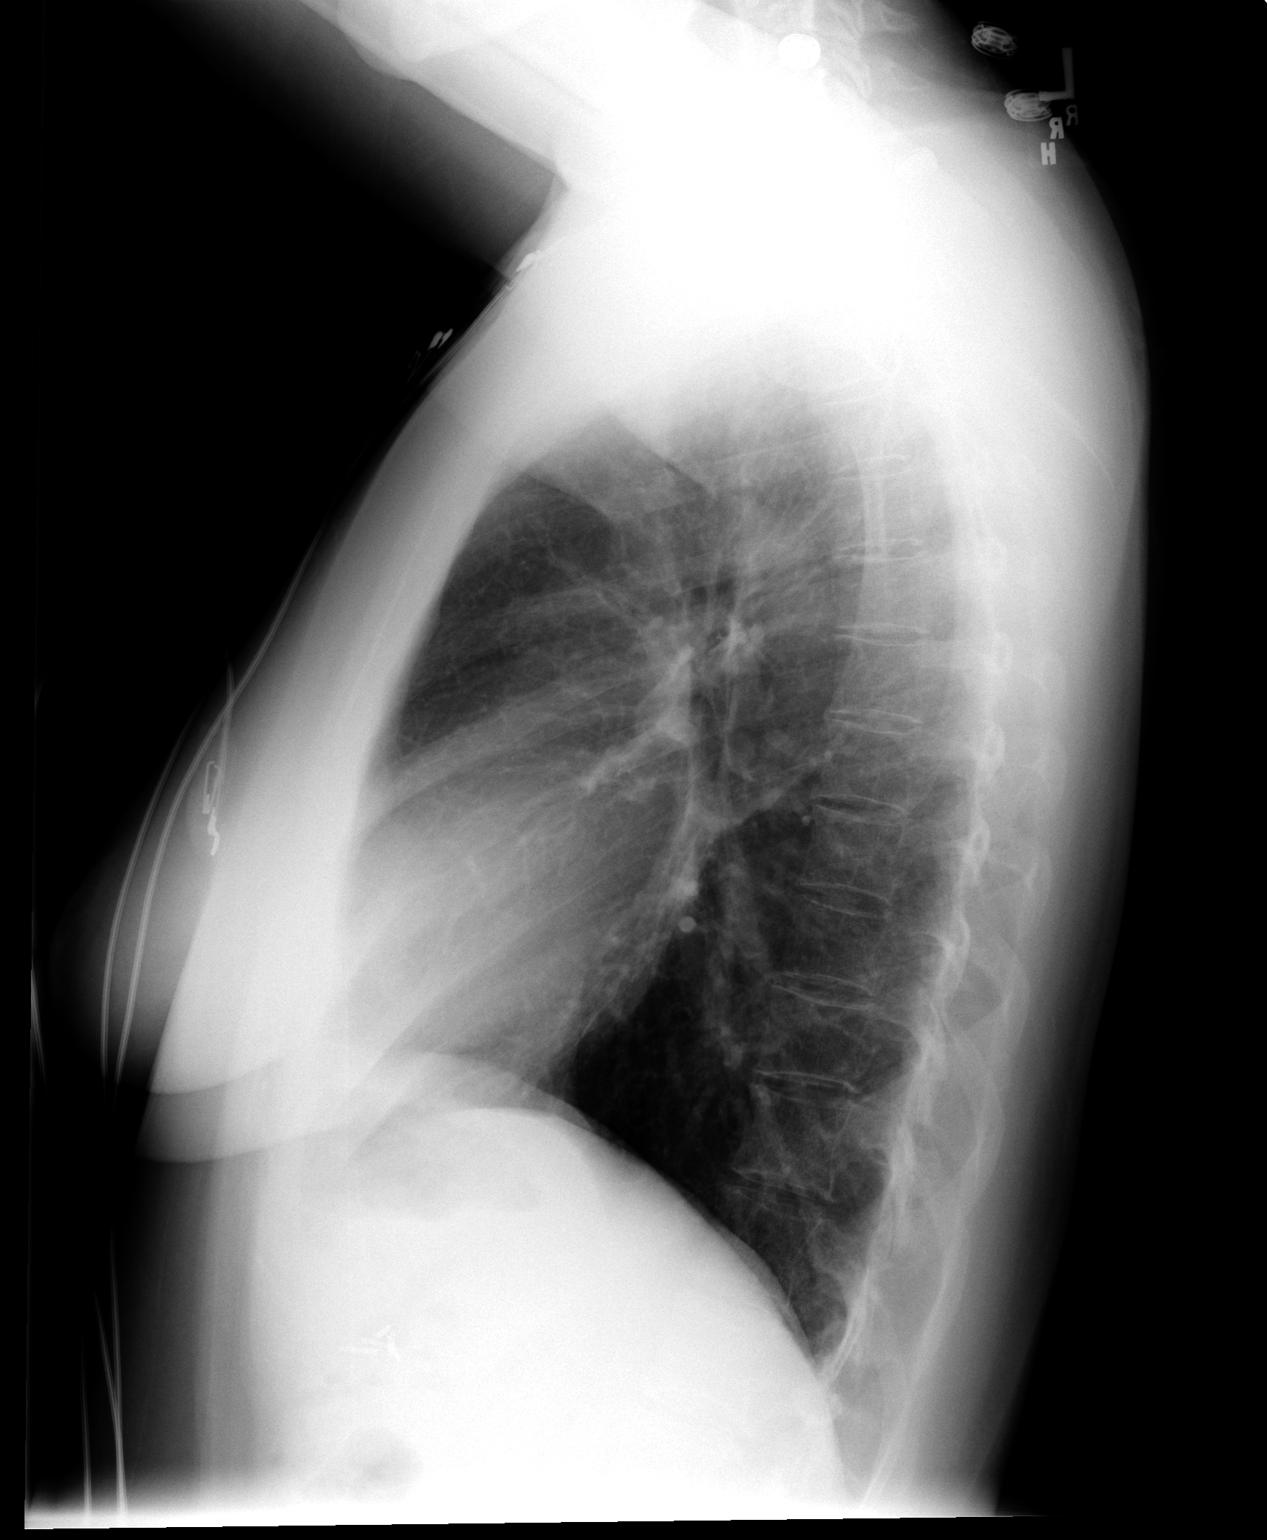

[2 of 2 positions shown; findings below may reference images not displayed]

FINDINGS: The lungs are adequately inflated and clear. There is no focal
infiltrate. There is no pleural effusion or pneumothorax. The
cardiopericardial silhouette is normal in size. The pulmonary
vascularity is not engorged. The mediastinum is normal in width.
There is no pleural effusion. The observed portions of the bony
thorax appear normal.
IMPRESSION: There is no evidence of active cardiopulmonary disease. 1 cannot
exclude acute bronchitis in the appropriate clinical setting.

## 2016-08-18 ENCOUNTER — Telehealth: Payer: Self-pay | Admitting: Neurology

## 2016-08-18 ENCOUNTER — Encounter: Payer: Self-pay | Admitting: Neurology

## 2016-08-18 ENCOUNTER — Ambulatory Visit (INDEPENDENT_AMBULATORY_CARE_PROVIDER_SITE_OTHER): Payer: BLUE CROSS/BLUE SHIELD | Admitting: Neurology

## 2016-08-18 VITALS — BP 121/73 | HR 52 | Ht 62.0 in | Wt 139.8 lb

## 2016-08-18 DIAGNOSIS — G43711 Chronic migraine without aura, intractable, with status migrainosus: Secondary | ICD-10-CM | POA: Insufficient documentation

## 2016-08-18 MED ORDER — PREDNISONE 10 MG PO TABS: ORAL_TABLET | ORAL | 1 refills | Status: DC

## 2016-08-18 MED ORDER — ONABOTULINUMTOXINA 100 UNITS IJ SOLR: INTRAMUSCULAR | 3 refills | Status: DC

## 2016-08-18 MED ORDER — PREDNISONE 10 MG PO TABS
ORAL_TABLET | ORAL | 1 refills | Status: DC
Start: 2016-08-18 — End: 2018-08-29

## 2016-08-18 NOTE — Telephone Encounter (Signed)
[facility] [place] [facility] [facility] [address] - [place], [place] - [address] Cx rx prednisone. Advised this was sent in error. Sent to different pharmacy per pt request. She verbalized understanding and cx rx.

## 2016-08-18 NOTE — Patient Instructions (Signed)
Remember to drink plenty of fluid, eat healthy meals and do not skip any meals. Try to eat protein with a every meal and eat a healthy snack such as fruit or nuts in between meals. Try to keep a regular sleep-wake schedule and try to exercise daily, particularly in the form of walking, 20-30 minutes a day, if you can.   As far as your medications are concerned, I would like to suggest: Steroid taper  60mg  x 2 days, 50mg  x 1 day, 40mg  x 1 day, 30mg  x 1 day, 20mg  x 1 day, 10mg  x 1 day  I would like to see you back for botox, sooner if we need to. Please call us with any interim questions, concerns, problems, updates or refill requests.   Our phone number is [phone]. We also have an after hours call service for urgent matters and there is a physician on-call for urgent questions. For any emergencies you know to call 911 or go to the nearest emergency room

## 2016-08-18 NOTE — Progress Notes (Signed)
[facility]    Provider:  Dr Lucia Gaskins Referring Provider: Lawerance Sabal PA Primary Care Physician:  Ignatius Specking, MD  CC:  Migraines for 30 years  HPI:  Patricia Silva is a 50 y.o. female here as a referral from Dr. Sherril Croon for migraines. Extensive family history of migraines. PMHx migraine and HTN and chronic migraines. Migraines in father and mother and other family members as well. At first when she was 15 it started around her menses. She has struggled on and off for many years. She has seen a neurologist in the past in [place]. She had an episode of right hand numbness and mouth "drawing" on the right side and she was given vitamin B12 shots. She has had testin gfrom a neurologist, she started with amitriptyline at that time and this did not help and gave her a lot of fatigue. She also saw another neurologist her in [place] and she did trigger point injections a series of 3. She goes in cycles. Since August she has had a continuous headache. Only 2 mornings she woke up without headache but they restarted at 9am. Her headaches have changed. They can be either on the right or on the left, she has nausea, vision changes, light and sound sensitivity, metallic taste in the mouth, vomiting as well. She takes Tizanidine.She has had to wear her sunglasses to work several days, she can;t stand the light. They are pounding, throbbing. In the last two months she had stabbbing pain behind the right eyeand she can't be still she has to pace, sometimes her eye will run and her nose will run. She has a lot of dizziness and fuzziness. She just started the propranolol and she feels like it may be helping.   Tried: Tizanidine, Topamax(went up to 150mg ), Propranolol(60mg ), Amitriptyline(100 mg), Imitrex gave her palpitations and she cannot take this class due to her TIA and side effects. Maxalt made things worse. Alleve, ibuprofen, tylenol. Never tried depakote, verapamil, cymbalta, effexor.    Review of Systems: Patient complains of symptoms per HPI as well as the following symptoms:fatigue, blurred vision, palpitations, flushing, spinning sensation, diarrhea, aching muscles, memory loss, headache, dizziness, insomnia, sleepiness, snoring, restless legs, depression, not enough sleeo, decreased energy, change in appettite, racing thoughts.   . Pertinent negatives per HPI. All others negative.   Social History   Social History  . Marital status: Married    Spouse name: N/A  . Number of children: N/A  . Years of education: N/A   Occupational History  . Not on file.   Social History Main Topics  . Smoking status: Never Smoker  . Smokeless tobacco: Not on file  . Alcohol use Yes  . Drug use: No  . Sexual activity: Not on file   Other Topics Concern  . Not on file   Social History Narrative   Lives with husband and one child   Caffeine use: chocolate   No drinks.    Family History  Problem Relation Age of Onset  . Pulmonary embolism Mother   . Heart disease Father   . Pulmonary embolism Brother   . Heart disease    . Arthritis    . Lung disease    . Cancer      Past Medical History:  Diagnosis Date  . History of Bell's palsy   . History of pneumonia   . HTN (hypertension)   . Migraines   . Personal history of PE (pulmonary embolism) 2011  . Vertigo  Past Surgical History:  Procedure Laterality Date  . APPENDECTOMY    . CHOLECYSTECTOMY    . LEFT HEART CATHETERIZATION WITH CORONARY ANGIOGRAM N/A 12/08/2013   Procedure: LEFT HEART CATHETERIZATION WITH CORONARY ANGIOGRAM;  Surgeon: Corky CraftsJayadeep S Varanasi, MD;  [facility];  Service: Cardiovascular;  Laterality: N/A;    Current Outpatient Prescriptions  Medication Sig Dispense Refill  . aspirin EC 81 MG tablet Take 162 mg by mouth at bedtime.    . propranolol (INDERAL) 60 MG tablet Take 60 mg by mouth at bedtime.    Marland Kitchen. tiZANidine (ZANAFLEX) 4 MG tablet Two tabs at onset of headache and two  more if needed     . topiramate (TOPAMAX) 100 MG tablet Take 100 mg by mouth daily. One and half tabs at bedtime  Per pt: she only takes one tablet at bedtime 08/18/16    . zolpidem (AMBIEN CR) 12.5 MG CR tablet Take 12.5 mg by mouth at bedtime as needed for sleep.    . botulinum toxin Type A (BOTOX) 100 units SOLR injection Inject IM every 3 months in office by provider 2 vial 3  . predniSONE (DELTASONE) 10 MG tablet 60mg  x 2 days, then decrease daily by one pill for a total of 7 days 30 tablet 1   No current facility-administered medications for this visit.     Allergies as of 08/18/2016 - Review Complete 08/18/2016  Allergen Reaction Noted  . Imitrex [sumatriptan base] Anaphylaxis and Other (See Comments) 10/03/2011  . Codeine  08/18/2016  . Morphine and related  10/03/2011    Vitals: BP 121/73 (BP Location: Right Arm, Patient Position: Sitting, Cuff Size: Normal)   Pulse (!) 52   Ht 5\' 2"  (1.575 m)   Wt 139 lb 12.8 oz (63.4 kg)   BMI 25.57 kg/m  Last Weight:  Wt Readings from Last 1 Encounters:  08/18/16 139 lb 12.8 oz (63.4 kg)   Last Height:   Ht Readings from Last 1 Encounters:  08/18/16 5\' 2"  (1.575 m)    Physical exam: Exam: Gen: NAD, conversant, well nourised, obese, well groomed                     CV: RRR, no MRG. No Carotid Bruits. No peripheral edema, warm, nontender Eyes: Conjunctivae clear without exudates or hemorrhage  Neuro: Detailed Neurologic Exam  Speech:    Speech is normal; fluent and spontaneous with normal comprehension.  Cognition:    The patient is oriented to person, place, and time;     recent and remote memory intact;     language fluent;     normal attention, concentration,     fund of knowledge Cranial Nerves:    The pupils are equal, round, and reactive to light. The fundi are normal and spontaneous venous pulsations are present. Visual fields are full to finger confrontation. Extraocular movements are intact. Trigeminal sensation is  intact and the muscles of mastication are normal. Left facial weakness (chronic, past bell's palsy). The palate elevates in the midline. Hearing intact. Voice is normal. Shoulder shrug is normal. The tongue has normal motion without fasciculations.   Coordination:    Normal finger to nose and heel to shin.   Gait:    Heel-toe and tandem gait are normal.   Motor Observation:    No asymmetry, no atrophy, and no involuntary movements noted. Tone:    Normal muscle tone.    Posture:    Posture is normal. normal erect  Strength:    Strength is V/V in the upper and lower limbs.      Sensation: intact to LT     Reflex Exam:  DTR's:    Deep tendon reflexes in the upper and lower extremities are normal bilaterally.   Toes:    The toes are downgoing bilaterally.   Clonus:    Clonus is absent.     Assessment/Plan:  Chronic intractable migraines  Recently started on propranolol and it may be helping, will continue at this time and can increase if needed and tolerated  Given her history of intractable migraine and failure of multiple medications recommend botox for rmigraine  Remember to drink plenty of fluid, eat healthy meals and do not skip any meals. Try to eat protein with a every meal and eat a healthy snack such as fruit or nuts in between meals. Try to keep a regular sleep-wake schedule and try to exercise daily, particularly in the form of walking, 20-30 minutes a day, if you can.   As far as your medications are concerned, I would like to suggest: Steroid taper  60mg  x 2 days, 50mg  x 1 day, 40mg  x 1 day, 30mg  x 1 day, 20mg  x 1 day, 10mg  x 1 day  I would like to see you back for botox, sooner if we need to. Please call us with any interim questions, concerns, problems, updates or refill requests.   Discussed: To prevent or relieve headaches, try the following: Cool Compress. Lie down and place a cool compress on your head.  Avoid headache triggers. If certain foods or  odors seem to have triggered your migraines in the past, avoid them. A headache diary might help you identify triggers.  Include physical activity in your daily routine. Try a daily walk or other moderate aerobic exercise.  Manage stress. Find healthy ways to cope with the stressors, such as delegating tasks on your to-do list.  Practice relaxation techniques. Try deep breathing, yoga, massage and visualization.  Eat regularly. Eating regularly scheduled meals and maintaining a healthy diet might help prevent headaches. Also, drink plenty of fluids.  Follow a regular sleep schedule. Sleep deprivation might contribute to headaches Consider biofeedback. With this mind-body technique, you learn to control certain bodily functions - such as muscle tension, heart rate and blood pressure - to prevent headaches or reduce headache pain.    Proceed to emergency room if you experience new or worsening symptoms or symptoms do not resolve, if you have new neurologic symptoms or if headache is severe, or for any concerning symptom.   CC:  Referring Provider Lawerance SabalWorley, Miranda PA Primary Care Physician:  Ignatius Speckinghruv B Vyas, MD   Naomie DeanAntonia Everlena Mackley, MD  [facility] [address] [place], [place] [id number]  Phone [phone] Fax [phone]  A total of 45 minutes was spent face-to-face with this patient. Over half this time was spent on counseling patient on the migraine diagnosis and different diagnostic and therapeutic options available.

## 2016-08-18 NOTE — Telephone Encounter (Signed)
Pt called in after her appt today to check on botox status. She would like to be notified as soon as possible if it is or is not covered. In general she would like to be given updates as they happen.

## 2016-08-20 ENCOUNTER — Encounter: Payer: Self-pay | Admitting: Neurology

## 2016-08-21 ENCOUNTER — Telehealth: Payer: Self-pay | Admitting: Neurology

## 2016-08-21 NOTE — Telephone Encounter (Signed)
[name]- can you call patient please? Thank you!

## 2016-08-21 NOTE — Telephone Encounter (Signed)
Patient is calling to see if there is something else she can do besides Botox  for migraines. She thinks her insurance will not cover Botox completely. Please calll and discuss.

## 2016-08-21 NOTE — Telephone Encounter (Signed)
Spoke with the patient who stated that she spoke with someone from her work that told her the botox would be expensive and this concerned her. She wanted to know if we could proceed with a different course of action. I informed her that even though they are employed under the same company the copay on her medication would be affected by many different variables such as deductible, the kind of plan she signed up for, out of pocket and copay assistance. I reminded her about the patient assistance program through botox and she stated that she had already signed up for that program. I informed her that they pay up to 1200 dollars a year towards her out of pocket cost. She said that she didn't want to order the medication and then not be able to afford it. I explained that the benefit of using the speciality pharmacy was that our office nor the patient would be held responsible for that copay if she decides not to proceed, we could always just cancel the order. She still seemed concerned so I told her that we would be happy to cancel the apt and I could ask Dr. Lucia GaskinsAhern about a different treatment option. She again expressed concern about ordering the medication and not wanting to pay the copay. I told her that I would be happy to send Dr. Lucia GaskinsAhern a message regarding treatment options but that I would not advise canceling her apt until she knows for sure how much the medication will cost. She was agreeable with this plan.

## 2016-08-21 NOTE — Telephone Encounter (Signed)
Dr Ahern- please advise 

## 2016-08-22 NOTE — Telephone Encounter (Signed)
Patricia Silva, if she doesn;t want botox that is totally fine. She just started the propranolol so we did decide to  wait and see if this helps (give it up to 8 weeks) then it can be increased thanks.

## 2016-08-23 NOTE — Telephone Encounter (Signed)
Called and spoke to pt. Relayed per AA,MD she can hold off on botox. That is completley fine with her. She should continue to take propranolol and give it up to 8 weeks to see if this helps. She verbalized understanding.  She is also wondering if Duwayne HeckDanielle was able to find out how much botox was going to cost her. Advised she is out sick today. Unsure when she will be back. I will send message to her and we will call her back next week to give her an update. She verbalized understanding.

## 2016-08-30 NOTE — Telephone Encounter (Signed)
Noted, thank you

## 2016-08-30 NOTE — Telephone Encounter (Signed)
I do not know the prices of medication because every patient's plan is different and their copay will be different. That is something the patient has to ask the pharmacy. I called the patient and informed her of this information. I also told her that according to the pharmacy they are still processing with her insurance so it will be a few days before they know her copay. She expressed understanding.

## 2016-09-11 ENCOUNTER — Ambulatory Visit: Payer: BLUE CROSS/BLUE SHIELD | Admitting: Neurology

## 2016-09-13 ENCOUNTER — Ambulatory Visit: Payer: BLUE CROSS/BLUE SHIELD | Admitting: Neurology

## 2018-08-29 ENCOUNTER — Encounter: Payer: Self-pay | Admitting: Neurology

## 2018-08-29 ENCOUNTER — Ambulatory Visit (INDEPENDENT_AMBULATORY_CARE_PROVIDER_SITE_OTHER): Payer: Commercial Managed Care - PPO | Admitting: Neurology

## 2018-08-29 VITALS — BP 164/90 | HR 52 | Ht 62.0 in | Wt 157.0 lb

## 2018-08-29 DIAGNOSIS — G43711 Chronic migraine without aura, intractable, with status migrainosus: Secondary | ICD-10-CM

## 2018-08-29 MED ORDER — ERENUMAB-AOOE 140 MG/ML ~~LOC~~ SOAJ: 140.0000 mg | SUBCUTANEOUS | 11 refills | Status: DC

## 2018-08-29 NOTE — Patient Instructions (Signed)
Erenumab: Patient drug information [employer] here. Copyright [id number] [employer] All rights reserved. (For additional information see "Erenumab: Drug information") Brand Names: [place]  Aimovig;  Aimovig (140 MG Dose) [DSC]  Brand Names: [place]  Aimovig  What is this drug used for?   It is used to prevent migraine headaches.  What do I need to tell my doctor BEFORE I take this drug?   If you have an allergy to this drug or any part of this drug.   If you are allergic to any drugs like this one, any other drugs, foods, or other substances. Tell your doctor about the allergy and what signs you had, like rash; hives; itching; shortness of breath; wheezing; cough; swelling of face, lips, tongue, or throat; or any other signs.   This drug may interact with other drugs or health problems.   Tell your doctor and pharmacist about all of your drugs (prescription or OTC, natural products, vitamins) and health problems. You must check to make sure that it is safe for you to take this drug with all of your drugs and health problems. Do not start, stop, or change the dose of any drug without checking with your doctor.  What are some things I need to know or do while I take this drug?   Tell all of your health care providers that you take this drug. This includes your doctors, nurses, pharmacists, and dentists.   If you have a latex allergy, talk with your doctor.   Tell your doctor if you are pregnant, plan on getting pregnant, or are breast-feeding. You will need to talk about the benefits and risks to you and the baby.  What are some side effects that I need to call my doctor about right away?   WARNING/CAUTION: Even though it may be rare, some people may have very bad and sometimes deadly side effects when taking a drug. Tell your doctor or get medical help right away if you have any of the following signs or symptoms that may be related to a very bad side effect:   Signs of an  allergic reaction, like rash; hives; itching; red, swollen, blistered, or peeling skin with or without fever; wheezing; tightness in the chest or throat; trouble breathing, swallowing, or talking; unusual hoarseness; or swelling of the mouth, face, lips, tongue, or throat.  What are some other side effects of this drug?   All drugs may cause side effects. However, many people have no side effects or only have minor side effects. Call your doctor or get medical help if any of these side effects or any other side effects bother you or do not go away:   Pain, redness, or swelling where the injection was given.   Constipation is common with this drug. In some people, severe constipation led to treatment in a hospital or surgery. Call your doctor right away if you have constipation that is severe or does not go away.   These are not all of the side effects that may occur. If you have questions about side effects, call your doctor. Call your doctor for medical advice about side effects.   You may report side effects to your national health agency.  How is this drug best taken?   Use this drug as ordered by your doctor. Read all information given to you. Follow all instructions closely.   It is given as a shot into the fatty part of the skin on the top of the  thigh, belly area, or upper arm.   If you will be giving yourself the shot, your doctor or nurse will teach you how to give the shot.   If stored in a refrigerator, let this drug come to room temperature before using it. Leave it at room temperature for at least 30 minutes. Do not heat this drug.   Protect from heat and sunlight.   Do not shake.   Do not give into skin within 2 inches of the belly button.   Do not give into skin that is irritated, tender, bruised, red, scaly, hard, scarred, or has stretch marks.   Do not use if the solution is cloudy, leaking, or has particles.   This drug is colorless to a faint yellow. Do not use if the solution  changes color.   Throw away after using. Do not use the device more than 1 time.   Throw away needles in a needle/sharp disposal box. Do not reuse needles or other items. When the box is full, follow all local rules for getting rid of it. Talk with a doctor or pharmacist if you have any questions.  What do I do if I miss a dose?   Take a missed dose as soon as you think about it.   After taking a missed dose, start a new schedule based on when the dose is taken.  How do I store and/or throw out this drug?   Store in a refrigerator. Do not freeze.   Store in the original container to protect from light.   Do not use if it has been frozen.   If you drop this drug on a hard surface, do not use it.   If needed, you may store at room temperature for up to 7 days. Write down the date you take this drug out of the refrigerator. If stored at room temperature and not used within 7 days, throw this drug away.   Do not put this drug back in the refrigerator after it has been stored at room temperature.   Keep all drugs in a safe place. Keep all drugs out of the reach of children and pets.   Throw away unused or expired drugs. Do not flush down a toilet or pour down a drain unless you are told to do so. Check with your pharmacist if you have questions about the best way to throw out drugs. There may be drug take-back programs in your area.  General drug facts   If your symptoms or health problems do not get better or if they become worse, call your doctor.   Do not share your drugs with others and do not take anyone else's drugs.   Keep a list of all your drugs (prescription, natural products, vitamins, OTC) with you. Give this list to your doctor.   Talk with the doctor before starting any new drug, including prescription or OTC, natural products, or vitamins.   Some drugs may have another patient information leaflet. If you have any questions about this drug, please talk with your doctor, nurse,  pharmacist, or other health care provider.   If you think there has been an overdose, call your poison control center or get medical care right away. Be ready to tell or show what was taken, how much, and when it hap

## 2018-08-29 NOTE — Progress Notes (Signed)
[facility]    Provider:  Dr Lucia Gaskins Referring Provider: Ignatius Specking, MD Primary Care Physician:  Ignatius Specking, MD  CC:  migraines  HPI:  Patricia Silva is a 52 y.o. female here as requested by Dr. Sherril Croon for migraines.  Past medical history migraines, hypertension, IBS, Bell's palsy, pulmonary embolism, vitamin D deficiency.  She has never smoked and alcohol occasionally. Her mother has migraines. Started at the age of 66. She has vomiting, nausea, diarrhea with the migraines, +photo/phobophobia. No frequent auras. But in the past she has seen light and smelled cigarette smoke. Cigarette smoke can trigger migraines. Most of the time the migraine is on the right, pounding/pulsating/throbbing. She loses vision in the right eye it goes dark.Husband here and provides information. She snores sometimes. Can last 24-72 hours. She has had a month or more with consistent headaches ebbing and flowing. No medication overuse. Daily headaches. >15 migraine days a month. She declined botox in the past.   Tried: topamax, propranolol, tizanidine, melatonin, amitriptyline,   Reviewed notes, labs and imaging from outside physicians, which showed:  Reviewed [facility] from [facility] Dr. Lonna Cobb.  Patient has headache, located diffusely, aching the symptom is ongoing.  In addition she reports nausea acute and intermittent sudden in onset.  Smelling cigarette smoke can trigger her headaches.  She is tried tizanidine as needed.  Also on propranolol extended release 80.  Takes Ambien at night for sleep.  Exam including neurologic exam unremarkable.  Patient has been treated in their office for acute migraines with Toradol 60 IM and Phenergan 25 mg IM.  Labs were drawn on March 2019.  BUN 16 and creatinine 1.03 otherwise unremarkable CMP, unremarkable CBC, TSH 1.14.  Review of Systems: Patient complains of symptoms per HPI as well as the following symptoms: Weight gain, fatigue, blurred vision, eye  pain, headache, numbness, weakness, insomnia, sleepiness, snoring, dizziness, feeling hot, increased thirst, snoring, depression, anxiety, not enough sleep, decreased energy, disinterest in activities, aching muscles, hypertension, migraine, PE, Bell's palsy x6, heart cath. Pertinent negatives and positives per HPI. All others negative.   Social History   Socioeconomic History  . Marital status: Married    Spouse name: Not on file  . Number of children: 4  . Years of education: Not on file  . Highest education level: Associate degree: academic program  Occupational History  . Not on file  Social Needs  . Financial resource strain: Not on file  . Food insecurity:    Worry: Not on file    Inability: Not on file  . Transportation needs:    Medical: Not on file    Non-medical: Not on file  Tobacco Use  . Smoking status: Never Smoker  . Smokeless tobacco: Never Used  Substance and Sexual Activity  . Alcohol use: Not Currently  . Drug use: No  . Sexual activity: Not on file  Lifestyle  . Physical activity:    Days per week: Not on file    Minutes per session: Not on file  . Stress: Not on file  Relationships  . Social connections:    Talks on phone: Not on file    Gets together: Not on file    Attends religious service: Not on file    Active member of club or organization: Not on file    Attends meetings of clubs or organizations: Not on file    Relationship status: Not on file  . Intimate partner violence:    Fear  of current or ex partner: Not on file    Emotionally abused: Not on file    Physically abused: Not on file    Forced sexual activity: Not on file  Other Topics Concern  . Not on file  Social History Narrative   Lives with husband    Caffeine use: chocolate   No drinks.   Right handed    Family History  Problem Relation Age of Onset  . Pulmonary embolism Mother   . Diabetes Mother   . Deep vein thrombosis Mother   . Clotting disorder Mother   . High  blood pressure Mother   . Migraines Mother   . Heart disease Father   . Prostate cancer Father   . COPD Father   . High blood pressure Father   . Migraines Brother   . Heart disease Unknown   . Arthritis Unknown   . Lung disease Unknown   . Cancer Unknown   . Migraines Sister   . Pulmonary embolism Maternal Aunt   . Migraines Daughter   . Migraines Daughter   . Migraines Son     Past Medical History:  Diagnosis Date  . History of Bell's palsy   . History of pneumonia   . HTN (hypertension)   . Migraines   . Personal history of PE (pulmonary embolism) 2011  . Vertigo     Past Surgical History:  Procedure Laterality Date  . APPENDECTOMY  2001  . CHOLECYSTECTOMY    . DILATION AND CURETTAGE OF UTERUS  1984  . LEFT HEART CATHETERIZATION WITH CORONARY ANGIOGRAM N/A 12/08/2013   Procedure: LEFT HEART CATHETERIZATION WITH CORONARY ANGIOGRAM;  Surgeon: Corky CraftsJayadeep S Varanasi, MD;  [facility] C[facility];  Service: Cardiovascular;  Laterality: N/A;  . PARTIAL HYSTERECTOMY  2009   vaginal    Current Outpatient Medications  Medication Sig Dispense Refill  . Calcium Carbonate Antacid (TUMS PO) Take by mouth as needed.    Marland Kitchen. ibuprofen (ADVIL,MOTRIN) 200 MG tablet Take 800 mg by mouth as needed.    . Melatonin 10 MG TABS Take 10 mg by mouth at bedtime.    . propranolol (INDERAL) 60 MG tablet Take 120 mg by mouth at bedtime.     . Pseudoephedrine HCl (SUDOGEST PO) Take by mouth as needed.    Marland Kitchen. tiZANidine (ZANAFLEX) 4 MG tablet Two tabs at onset of headache and two more if needed     . zolpidem (AMBIEN CR) 12.5 MG CR tablet Take 12.5 mg by mouth at bedtime as needed for sleep.    Marland Kitchen. aspirin EC 81 MG tablet Take 162 mg by mouth at bedtime.     No current facility-administered medications for this visit.     Allergies as of 08/29/2018 - Review Complete 08/29/2018  Allergen Reaction Noted  . Imitrex [sumatriptan base] Anaphylaxis and Other (See Comments) 10/03/2011  . Codeine  08/18/2016   . Morphine and related  10/03/2011    Vitals: BP (!) 164/90 (BP Location: Right Arm, Patient Position: Sitting) Comment: recent increase in BP meds  Pulse (!) 52   Ht 5\' 2"  (1.575 m)   Wt 157 lb (71.2 kg)   BMI 28.72 kg/m  Last Weight:  Wt Readings from Last 1 Encounters:  08/29/18 157 lb (71.2 kg)   Last Height:   Ht Readings from Last 1 Encounters:  08/29/18 5\' 2"  (1.575 m)    Physical exam: Exam: Gen: NAD, conversant, well nourised,  well groomed  CV: RRR, no MRG. No Carotid Bruits. No peripheral edema, warm, nontender Eyes: Conjunctivae clear without exudates or hemorrhage  Neuro: Detailed Neurologic Exam  Speech:    Speech is normal; fluent and spontaneous with normal comprehension.  Cognition:    The patient is oriented to person, place, and time;     recent and remote memory intact;     language fluent;     normal attention, concentration,     fund of knowledge Cranial Nerves:    The pupils are equal, round, and reactive to light. The fundi are normal and spontaneous venous pulsations are present. Visual fields are full to finger confrontation. Extraocular movements are intact. Trigeminal sensation is intact and the muscles of mastication are normal. The face is symmetric. The palate elevates in the midline. Hearing intact. Voice is normal. Shoulder shrug is normal. The tongue has normal motion without fasciculations.   Coordination:    Normal finger to nose and heel to shin. Normal rapid alternating movements.   Gait:    Heel-toe and tandem gait are normal.   Motor Observation:    No asymmetry, no atrophy, and no involuntary movements noted. Tone:    Normal muscle tone.    Posture:    Posture is normal. normal erect    Strength:    Strength is V/V in the upper and lower limbs.      Sensation: intact to LT     Reflex Exam:  DTR's:    Deep tendon reflexes in the upper and lower extremities are normal bilaterally.   Toes:    The  toes are downgoing bilaterally.   Clonus:    Clonus is absent.      Assessment/Plan:  52 year old with chronic intractable migraines.  For >30 years failed "everything"  Not interested in Botox Start Aimovig.  Discussed: To prevent or relieve headaches, try the following: Cool Compress. Lie down and place a cool compress on your head.  Avoid headache triggers. If certain foods or odors seem to have triggered your migraines in the past, avoid them. A headache diary might help you identify triggers.  Include physical activity in your daily routine. Try a daily walk or other moderate aerobic exercise.  Manage stress. Find healthy ways to cope with the stressors, such as delegating tasks on your to-do list.  Practice relaxation techniques. Try deep breathing, yoga, massage and visualization.  Eat regularly. Eating regularly scheduled meals and maintaining a healthy diet might help prevent headaches. Also, drink plenty of fluids.  Follow a regular sleep schedule. Sleep deprivation might contribute to headaches Consider biofeedback. With this mind-body technique, you learn to control certain bodily functions - such as muscle tension, heart rate and blood pressure - to prevent headaches or reduce headache pain.    Proceed to emergency room if you experience new or worsening symptoms or symptoms do not resolve, if you have new neurologic symptoms or if headache is severe, or for any concerning symptom.   Provided education and documentation from American headache Society toolbox including articles on: chronic migraine medication overuse headache, chronic migraines, prevention of migraines, behavioral and other nonpharmacologic treatments for headache.   Cc: Ignatius Specking, MD  Naomie Dean, MD  [facility] [address] [place], [place] [address]  Phone [phone] Fax [phone]

## 2018-09-02 ENCOUNTER — Telehealth: Payer: Self-pay | Admitting: *Deleted

## 2018-09-02 NOTE — Telephone Encounter (Signed)
Completed PA for Aimovig 140 mg on Cover My Meds. KEY: [id number]: [id number]. Called pt and she stated that she took Amitriptyline for several months. Anticipate determination within 72 hours.

## 2018-09-03 NOTE — Telephone Encounter (Signed)
Aimovig was approved 09/02/18 through 03/03/2019.

## 2018-09-03 NOTE — Telephone Encounter (Signed)
Left a voicemail for Patricia Silva letting her know that her Aimovig was approved until 03/03/2019. I attempted to call the pharmacy but they do not open until 9 am. Office number provide for Mrs. Sheahan in case she has any questions or concerns. I will call her pharmacy back after 9 am to update them on her medication.

## 2018-09-03 NOTE — Telephone Encounter (Signed)
Pt wanting to let Dr. Lucia GaskinsAhern know that she has also tried Trokendi for a few months- did not work for her. FYI

## 2018-10-23 ENCOUNTER — Telehealth: Payer: Self-pay | Admitting: Neurology

## 2018-10-23 NOTE — Telephone Encounter (Signed)
Called pt and discussed that numbness in hands is not a side effect of Aimovig that Dr. Lucia GaskinsAhern is aware of. Pt was relieved and will plan to continue the medication. Pt does report constipation however she is said that is ok. Pt advised to contact us if this is not manageable or she can contact pcp for management of constipation if needed. Pt verbalized understanding. Discussed her hand numbness. She stated that the hand numbness is symmetrical and it gets better as the day progresses. She said she usually sleeps on one or the other hand but not both. She is aware that per Dr. Lucia GaskinsAhern this may be coincidental, it could be carpal tunnel syndrome perhaps. Advised pt that Dr. Lucia GaskinsAhern has offered an EMG nerve conduction study for pt if she would like. She could also see PCP. Pt verbalized understanding and appreciation. She will think about it and call back if she decides.

## 2018-10-23 NOTE — Telephone Encounter (Signed)
Pt called stating that since she started Erenumab-aooe (AIMOVIG) 140 MG/ML SOAJ she has had numbness in both hands every morning. Pt states that the medication works great but is worried about this symptom. Please advise.

## 2019-01-02 ENCOUNTER — Telehealth: Payer: Self-pay | Admitting: Neurology

## 2019-01-02 NOTE — Telephone Encounter (Signed)
I spoke with the patient and she stated that she has had OTC stool softener and benefiber. Each month the constipation gets worse. She vomited last night. The enema helped a little bit but she hasn't tried to eat anything today other than a little water and is still nauseated. I advised pt to stop the Aimovig as she was wondering if she should continue it. I advised not to eat anything else right now but to call her primary care and discuss her symptoms. I also told her I would pass a message to Dr. Lucia Gaskins. Pt verbalized appreciation and understanding.

## 2019-01-02 NOTE — Addendum Note (Signed)
Addended by: Bertram Savin on: 01/02/2019 12:03 PM   Modules accepted: Orders

## 2019-01-02 NOTE — Telephone Encounter (Signed)
If the medication is helpng we can switch to ajovy or emgality

## 2019-01-02 NOTE — Telephone Encounter (Signed)
Pt has called to inform that since she has been on Erenumab-aooe (AIMOVIG) 140 MG/ML SOAJ she has had constipation and has gotten worse each month.  Pt states on the 16th of this month she took the Erenumab-aooe (AIMOVIG) 140 MG/ML SOAJ and has vomited, had to have an enma and doesn't feel she will be able to continue on this medication.  Please call

## 2019-01-13 NOTE — Telephone Encounter (Signed)
I spoke with the patient. The constipation is better. She thinks the Aimovig did help. She is fine with trying another injectable. Discussed the difference between the two. Discussed Emgality likely preferred next by insurance. She also stated that she would like to discuss botox at her upcoming appt with Dr. Lucia Gaskins. Advised we are currently doing virtual visits. I advised that I would send her message to Dr. Lucia Gaskins and see what MD suggests. She verbalized appreciation.

## 2019-01-13 NOTE — Telephone Encounter (Signed)
Lets set up a phone call or a virtual visit with me if she is ok with that, I would like to talk to her and see how she is doing. thanls

## 2019-01-14 NOTE — Telephone Encounter (Signed)
Spoke with pt. Discussed Dr. Trevor Mace message. Pt consented to do a virtual visit. She is aware that although there will not be a copay, we will file with her insurance like an office visit so there may be a patient responsible charge. She is also aware that although there may be limitations with this type of visit, we will take all precautions to reduce any security or privacy concerns. Pt's email is [email or web address]. She is aware that we will email her an invitation to join. Aware that her audio & video must be functioning on her device. Pt will be using a smart phone and is aware to download [employer] web[employer] meetings app prior to her appt. She can join meeting 5 minutes prior. I scheduled her for Thursday 01/16/2019 @ 11:30 AM. Pt verbalized appreciation for the call.

## 2019-01-14 NOTE — Telephone Encounter (Addendum)
Called pt's mobile # and LVM asking for call back. Advised we would like to try to setup a phone call or virtual visit with Dr. Lucia Gaskins. Tried to call pt's work phone but received busy signal.

## 2019-01-15 ENCOUNTER — Encounter: Payer: Self-pay | Admitting: Neurology

## 2019-01-15 NOTE — Telephone Encounter (Signed)
Spoke to pt and updated her allergies, medications, no change medical /surgical hx.

## 2019-01-16 ENCOUNTER — Telehealth: Payer: Self-pay

## 2019-01-16 ENCOUNTER — Ambulatory Visit (INDEPENDENT_AMBULATORY_CARE_PROVIDER_SITE_OTHER): Payer: Commercial Managed Care - PPO | Admitting: Neurology

## 2019-01-16 ENCOUNTER — Other Ambulatory Visit: Payer: Self-pay

## 2019-01-16 DIAGNOSIS — G43711 Chronic migraine without aura, intractable, with status migrainosus: Secondary | ICD-10-CM

## 2019-01-16 MED ORDER — FREMANEZUMAB-VFRM 225 MG/1.5ML ~~LOC~~ SOSY: 225.0000 mg | PREFILLED_SYRINGE | SUBCUTANEOUS | 11 refills | Status: DC

## 2019-01-16 NOTE — Progress Notes (Signed)
[facility]    Provider:  Dr Lucia Gaskins Referring Provider: Ignatius Specking, MD Primary Care Physician:  Ignatius Specking, MD  CC:  Migraines  Virtual Visit via Video Note  I connected with [name] on 01/20/19 at 11:30 AM EDT by a video enabled telemedicine application and verified that I am speaking with the correct person using two identifiers.   I discussed the limitations of evaluation and management by telemedicine and the availability of in person appointments. The patient expressed understanding and agreed to proceed.  Interval history 01/16/2019: Patient with chronic migraines has "failed everything".  Patient was doing very well on Aimovig however experienced constipation.  We tried stool softeners and over-the-counter medications unfortunately progressed to the point where she is having abdominal pain and nausea.  We stopped the Aimovig and patient is feeling better.  Patient's failed so many medications, we had a long discussion of possibilities and patient decided to try a different CGRP medication but does not have constipation as a side effect.  We discussed atrophy versus Aimovig, decided to try atrophy, discussed the co-pay card with her as well.  Also discussed Botox for migraines again.   HPI:  Patricia Silva is a 53 y.o. female here as requested by Dr. Sherril Croon for migraines.  Past medical history migraines, hypertension, IBS, Bell's palsy, pulmonary embolism, vitamin D deficiency.  She has never smoked and alcohol occasionally. Her mother has migraines. Started at the age of 74. She has vomiting, nausea, diarrhea with the migraines, +photo/phobophobia. No frequent auras. But in the past she has seen light and smelled cigarette smoke. Cigarette smoke can trigger migraines. Most of the time the migraine is on the right, pounding/pulsating/throbbing. She loses vision in the right eye it goes dark.Husband here and provides information. She snores sometimes. Can last  24-72 hours. She has had a month or more with consistent headaches ebbing and flowing. No medication overuse. Daily headaches. >15 migraine days a month. She declined botox in the past.   Tried: topamax, propranolol, tizanidine, melatonin, amitriptyline,   Reviewed notes, labs and imaging from outside physicians, which showed:  Reviewed [facility] from [facility] Dr. Lonna Cobb.  Patient has headache, located diffusely, aching the symptom is ongoing.  In addition she reports nausea acute and intermittent sudden in onset.  Smelling cigarette smoke can trigger her headaches.  She is tried tizanidine as needed.  Also on propranolol extended release 80.  Takes Ambien at night for sleep.  Exam including neurologic exam unremarkable.  Patient has been treated in their office for acute migraines with Toradol 60 IM and Phenergan 25 mg IM.  Labs were drawn on March 2019.  BUN 16 and creatinine 1.03 otherwise unremarkable CMP, unremarkable CBC, TSH 1.14.  Review of Systems: Patient complains of symptoms per HPI as well as the following symptoms: Weight gain, fatigue, blurred vision, eye pain, headache, numbness, weakness, insomnia, sleepiness, snoring, dizziness, feeling hot, increased thirst, snoring, depression, anxiety, not enough sleep, decreased energy, disinterest in activities, aching muscles, hypertension, migraine, PE, Bell's palsy x6, heart cath. Pertinent negatives and positives per HPI. All others negative.   Social History   Socioeconomic History  . Marital status: Married    Spouse name: Not on file  . Number of children: 4  . Years of education: Not on file  . Highest education level: Associate degree: academic program  Occupational History  . Not on file  Social Needs  . Financial resource strain: Not on file  .  Food insecurity:    Worry: Not on file    Inability: Not on file  . Transportation needs:    Medical: Not on file    Non-medical: Not on file  Tobacco Use  . Smoking  status: Never Smoker  . Smokeless tobacco: Never Used  Substance and Sexual Activity  . Alcohol use: Not Currently  . Drug use: No  . Sexual activity: Not on file  Lifestyle  . Physical activity:    Days per week: Not on file    Minutes per session: Not on file  . Stress: Not on file  Relationships  . Social connections:    Talks on phone: Not on file    Gets together: Not on file    Attends religious service: Not on file    Active member of club or organization: Not on file    Attends meetings of clubs or organizations: Not on file    Relationship status: Not on file  . Intimate partner violence:    Fear of current or ex partner: Not on file    Emotionally abused: Not on file    Physically abused: Not on file    Forced sexual activity: Not on file  Other Topics Concern  . Not on file  Social History Narrative   Lives with husband    Caffeine use: chocolate   No drinks.   Right handed    Family History  Problem Relation Age of Onset  . Pulmonary embolism Mother   . Diabetes Mother   . Deep vein thrombosis Mother   . Clotting disorder Mother   . High blood pressure Mother   . Migraines Mother   . Heart disease Father   . Prostate cancer Father   . COPD Father   . High blood pressure Father   . Migraines Brother   . Heart disease Other   . Arthritis Other   . Lung disease Other   . Cancer Other   . Migraines Sister   . Pulmonary embolism Maternal Aunt   . Migraines Daughter   . Migraines Daughter   . Migraines Son     Past Medical History:  Diagnosis Date  . History of Bell's palsy   . History of pneumonia   . HTN (hypertension)   . Migraines   . Personal history of PE (pulmonary embolism) 2011  . Vertigo     Past Surgical History:  Procedure Laterality Date  . APPENDECTOMY  2001  . CHOLECYSTECTOMY    . DILATION AND CURETTAGE OF UTERUS  1984  . LEFT HEART CATHETERIZATION WITH CORONARY ANGIOGRAM N/A 12/08/2013   Procedure: LEFT HEART  CATHETERIZATION WITH CORONARY ANGIOGRAM;  Surgeon: Corky Crafts, MD;  Location: [facility] CATH LAB;  Service: Cardiovascular;  Laterality: N/A;  . PARTIAL HYSTERECTOMY  2009   vaginal    Current Outpatient Medications  Medication Sig Dispense Refill  . aspirin EC 81 MG tablet Take 162 mg by mouth at bedtime.    . Calcium Carbonate Antacid (TUMS PO) Take by mouth as needed.    . Fremanezumab-vfrm (AJOVY) 225 MG/1.5ML SOSY Inject 225 mg into the skin every 30 (thirty) days. 1 Syringe 11  . ibuprofen (ADVIL,MOTRIN) 200 MG tablet Take 800 mg by mouth as needed.    Marland Kitchen lisinopril (PRINIVIL,ZESTRIL) 10 MG tablet Take 10 mg by mouth daily.    . Melatonin 10 MG TABS Take 20 mg by mouth at bedtime.     . propranolol (INDERAL) 60 MG tablet  Take 120 mg by mouth at bedtime.     . Pseudoephedrine HCl (SUDOGEST PO) Take by mouth as needed.    Marland Kitchen tiZANidine (ZANAFLEX) 4 MG tablet Two tabs at onset of headache and two more if needed     . zolpidem (AMBIEN CR) 12.5 MG CR tablet Take 12.5 mg by mouth at bedtime as needed for sleep.     No current facility-administered medications for this visit.     Allergies as of 01/16/2019 - Review Complete 01/15/2019  Allergen Reaction Noted  . Imitrex [sumatriptan base] Anaphylaxis and Other (See Comments) 10/03/2011  . Aimovig [erenumab-aooe]  01/02/2019  . Codeine  08/18/2016  . Morphine and related  10/03/2011    Vitals: There were no vitals taken for this visit. Last Weight:  Wt Readings from Last 1 Encounters:  08/29/18 157 lb (71.2 kg)   Last Height:   Ht Readings from Last 1 Encounters:  08/29/18 5\' 2"  (1.575 m)    Physical exam: Exam: Gen: NAD, conversant, well nourised,  well groomed                     CV: RRR, no MRG. No Carotid Bruits. No peripheral edema, warm, nontender Eyes: Conjunctivae clear without exudates or hemorrhage  Neuro: Detailed Neurologic Exam  Speech:    Speech is normal; fluent and spontaneous with normal comprehension.   Cognition:    The patient is oriented to person, place, and time;     recent and remote memory intact;     language fluent;     normal attention, concentration,     fund of knowledge Cranial Nerves:    The pupils are equal, round, and reactive to light. The fundi are normal and spontaneous venous pulsations are present. Visual fields are full to finger confrontation. Extraocular movements are intact. Trigeminal sensation is intact and the muscles of mastication are normal. The face is symmetric. The palate elevates in the midline. Hearing intact. Voice is normal. Shoulder shrug is normal. The tongue has normal motion without fasciculations.   Coordination:    Normal finger to nose and heel to shin. Normal rapid alternating movements.   Gait:    Heel-toe and tandem gait are normal.   Motor Observation:    No asymmetry, no atrophy, and no involuntary movements noted. Tone:    Normal muscle tone.    Posture:    Posture is normal. normal erect    Strength:    Strength is V/V in the upper and lower limbs.      Sensation: intact to LT     Reflex Exam:  DTR's:    Deep tendon reflexes in the upper and lower extremities are normal bilaterally.   Toes:    The toes are downgoing bilaterally.   Clonus:    Clonus is absent.      Assessment/Plan:  53 year old with chronic intractable migraines.  For >30 years failed "everything".  Severe constipation with Aimovig Not interested in Botox Start Ajovy   Meds ordered this encounter  Medications  . Fremanezumab-vfrm (AJOVY) 225 MG/1.5ML SOSY    Sig: Inject 225 mg into the skin every 30 (thirty) days.    Dispense:  1 Syringe    Refill:  11    Patient has copay card can have medication for $5 regardless of insurance authorization or copay amount.   Follow Up Instructions:    I discussed the assessment and treatment plan with the patient. The patient was provided  an opportunity to ask questions and all were answered. The patient  agreed with the plan and demonstrated an understanding of the instructions.   The patient was advised to call back or seek an in-person evaluation if the symptoms worsen or if the condition fails to improve as anticipated.  Discussed: To prevent or relieve headaches, try the following: Cool Compress. Lie down and place a cool compress on your head.  Avoid headache triggers. If certain foods or odors seem to have triggered your migraines in the past, avoid them. A headache diary might help you identify triggers.  Include physical activity in your daily routine. Try a daily walk or other moderate aerobic exercise.  Manage stress. Find healthy ways to cope with the stressors, such as delegating tasks on your to-do list.  Practice relaxation techniques. Try deep breathing, yoga, massage and visualization.  Eat regularly. Eating regularly scheduled meals and maintaining a healthy diet might help prevent headaches. Also, drink plenty of fluids.  Follow a regular sleep schedule. Sleep deprivation might contribute to headaches Consider biofeedback. With this mind-body technique, you learn to control certain bodily functions - such as muscle tension, heart rate and blood pressure - to prevent headaches or reduce headache pain.    Proceed to emergency room if you experience new or worsening symptoms or symptoms do not resolve, if you have new neurologic symptoms or if headache is severe, or for any concerning symptom.   Provided education and documentation from American headache Society toolbox including articles on: chronic migraine medication overuse headache, chronic migraines, prevention of migraines, behavioral and other nonpharmacologic treatments for headache.  A total of 25 minutes was spent Video face-to-face(not in person) with this patient. Over half this time was spent on counseling patient on the  1. Chronic migraine without aura, with intractable migraine, so stated, with status migrainosus     diagnosis and different diagnostic and therapeutic options, counseling and coordination of care, risks ans benefits of management, compliance, or risk factor reduction and education.    Cc: Ignatius Specking, MD  Naomie Dean, MD  [facility] [address] [place], [place] [address]  Phone [phone] Fax [phone]

## 2019-01-16 NOTE — Telephone Encounter (Signed)
Pending approval for Ajovy 225 mg/mL Key- [id number] Rx #: [id number] PA Case ID: [id number] ICD 10 codes: G43.711 Tried medications:  topamax, propranolol, tizanidine, melatonin, amitriptyline  I will update once a decision has been made.

## 2019-01-20 ENCOUNTER — Encounter: Payer: Self-pay | Admitting: Neurology

## 2019-01-20 NOTE — Telephone Encounter (Signed)
Request Reference Number: [id number]. AJOVY INJ 225/1.5 is denied due to Plan Exclusion. For further questions, call [phone]. Appeals are not supported through ePA. Please refer to the fax case notice for appeals information and instructions

## 2019-01-20 NOTE — Telephone Encounter (Signed)
Called pt and let her know that her insurance does not cover Ajovy. It has denied the medication. Discussed that she should still be able to use the Ajovy savings card ($5/month copay) which she can get from [email or web address] Asked for call back if she has any problems with the card. She verbalized understanding and appreciation.   If appeal should be desired, send to fax # [phone].

## 2019-02-18 ENCOUNTER — Telehealth: Payer: Self-pay | Admitting: Neurology

## 2019-02-18 NOTE — Telephone Encounter (Signed)
02-18-2019 Pt has called and gave verbal consent to file insurance for the [email or web address] email is:[email or web address]  Pt understands that although there may be some limitations with this type of visit, we will take all precautions to reduce any security or privacy concerns.  Pt understands that this will be treated like an in office visit and we will file with pt's insurance, and there may be a patient responsible charge related to this service.

## 2019-02-19 NOTE — Telephone Encounter (Signed)
Noted patient canceled her appt and didn't want to reschedule.

## 2019-02-24 ENCOUNTER — Telehealth: Payer: Self-pay | Admitting: Neurology

## 2019-02-24 NOTE — Telephone Encounter (Signed)
Pt states she is still having headaches, feeling sick to the stomach, gaining weight and is getting anxious with the Ajovy injections. Pt states that she thinks that she is done with these injections. Pt would like to have other options. Please advise.

## 2019-02-25 NOTE — Telephone Encounter (Signed)
I called her today, she asked for me to call back tomorrow instead.

## 2019-03-03 NOTE — Telephone Encounter (Signed)
Called, left a message to email me via epic to discuss.

## 2019-03-04 ENCOUNTER — Ambulatory Visit: Payer: Commercial Managed Care - PPO | Admitting: Neurology

## 2019-09-22 ENCOUNTER — Encounter: Payer: Self-pay | Admitting: Gastroenterology

## 2019-09-22 ENCOUNTER — Other Ambulatory Visit: Payer: Self-pay

## 2019-09-22 ENCOUNTER — Ambulatory Visit (INDEPENDENT_AMBULATORY_CARE_PROVIDER_SITE_OTHER): Payer: Commercial Managed Care - PPO | Admitting: Gastroenterology

## 2019-09-22 VITALS — BP 98/66 | HR 64 | Temp 96.7°F | Ht 62.0 in | Wt 164.2 lb

## 2019-09-22 DIAGNOSIS — R197 Diarrhea, unspecified: Secondary | ICD-10-CM

## 2019-09-22 DIAGNOSIS — K625 Hemorrhage of anus and rectum: Secondary | ICD-10-CM | POA: Insufficient documentation

## 2019-09-22 NOTE — Patient Instructions (Signed)
1. Please have celiac labs done as soon as you can.  2. Colonoscopy as scheduled. See separate instructions.

## 2019-09-22 NOTE — Progress Notes (Signed)
Primary Care Physician:  Lianne Morisarroll, Erin, PA-C  Primary Gastroenterologist:  Roetta SessionsMichael Rourk, MD   Chief Complaint  Patient presents with  . Colonoscopy    never had tcs  . Rectal Bleeding  . Diarrhea    occ, had cholecystectomy    HPI:  Patricia Silva is a 53 y.o. female here at the request of Lianne Morisrin Carroll, PA-C for consideration of colonoscopy.  Patient has never had a colonoscopy.  Patient has a history of intermittent diarrhea chronically since her cholecystectomy years ago.  Depending on what she eats, she may have increased diarrhea.  Does not tolerate dairy or chocolate.  Sometimes she eats the right things and still has diarrhea.  Denies any nocturnal symptoms but will wake up early morning hours with abdominal cramping and diarrhea.  Sometimes with her ocular migraines, she gets vomiting and diarrhea.  This happened recently but diarrhea lasted for about 24 hours.  She had a couple episodes of small-volume hematochezia.  She presented with pictures which showed bright red blood with mucus 1 to 2 tablespoons.  She believes she has hemorrhoids.  She has intermittent heartburn.  Previously was on medication prescribed to take daily.  She had lost a significant amount of weight and no longer needed it, but with weight gain on steroids (up 30 lbs) she has had some recurrent reflux.  Takes her medication as needed only.  Cannot recall the name.    She has a history of remote EGD with esophageal dilation back in the 90s by Dr. Jena Gaussourk.  Records not available.    Family history significant for blood clots.  Her mother had 15-20 blood clots while living.  Maternal aunt/uncle/cousin all with PEs.  Patient has had a PE herself but reports having been tested for clotting disorder and was negative.   Current Outpatient Medications  Medication Sig Dispense Refill  . Calcium Carbonate Antacid (TUMS PO) Take by mouth as needed.    Marland Kitchen. ibuprofen (ADVIL,MOTRIN) 200 MG tablet Take 800 mg by mouth as needed.     Marland Kitchen. lisinopril (PRINIVIL,ZESTRIL) 10 MG tablet Take 10 mg by mouth daily.    . Melatonin 10 MG TABS Take 20 mg by mouth at bedtime.     . propranolol (INDERAL) 60 MG tablet Take 120 mg by mouth at bedtime.     . Pseudoephedrine HCl (SUDOGEST PO) Take by mouth as needed.    Marland Kitchen. tiZANidine (ZANAFLEX) 4 MG tablet Two tabs at onset of headache and two more if needed     . zolpidem (AMBIEN CR) 12.5 MG CR tablet Take 12.5 mg by mouth at bedtime as needed for sleep.     No current facility-administered medications for this visit.     Allergies as of 09/22/2019 - Review Complete 09/22/2019  Allergen Reaction Noted  . Imitrex [sumatriptan base] Anaphylaxis and Other (See Comments) 10/03/2011  . Aimovig [erenumab-aooe]  01/02/2019  . Codeine  08/18/2016  . Morphine and related  10/03/2011    Past Medical History:  Diagnosis Date  . History of Bell's palsy   . History of pneumonia   . HTN (hypertension)   . Migraines   . Migraines   . Personal history of PE (pulmonary embolism) 2011  . Vertigo     Past Surgical History:  Procedure Laterality Date  . APPENDECTOMY  2001  . CHOLECYSTECTOMY    . DILATION AND CURETTAGE OF UTERUS  1984  . LEFT HEART CATHETERIZATION WITH CORONARY ANGIOGRAM N/A 12/08/2013   Procedure: LEFT  HEART CATHETERIZATION WITH CORONARY ANGIOGRAM;  Surgeon: Jettie Booze, MD;  Location: [facility] CATH LAB;  Service: Cardiovascular;  Laterality: N/A;  . PARTIAL HYSTERECTOMY  2009   vaginal    Family History  Problem Relation Age of Onset  . Pulmonary embolism Mother   . Diabetes Mother   . Deep vein thrombosis Mother   . Clotting disorder Mother   . High blood pressure Mother   . Migraines Mother   . Heart disease Father   . Prostate cancer Father   . COPD Father   . High blood pressure Father   . Migraines Brother   . Heart disease Other   . Arthritis Other   . Lung disease Other   . Cancer Other   . Migraines Sister   . Pulmonary embolism Maternal Aunt   .  Migraines Daughter   . Migraines Daughter   . Migraines Son   . Pulmonary embolism Cousin   . Pulmonary embolism Maternal Uncle   . Colon cancer Neg Hx     Social History   Socioeconomic History  . Marital status: Married    Spouse name: Not on file  . Number of children: 4  . Years of education: Not on file  . Highest education level: Associate degree: academic program  Occupational History  . Occupation: Nurse  Social Needs  . Financial resource strain: Not on file  . Food insecurity    Worry: Not on file    Inability: Not on file  . Transportation needs    Medical: Not on file    Non-medical: Not on file  Tobacco Use  . Smoking status: Never Smoker  . Smokeless tobacco: Never Used  Substance and Sexual Activity  . Alcohol use: Not Currently  . Drug use: No  . Sexual activity: Not on file  Lifestyle  . Physical activity    Days per week: Not on file    Minutes per session: Not on file  . Stress: Not on file  Relationships  . Social Herbalist on phone: Not on file    Gets together: Not on file    Attends religious service: Not on file    Active member of club or organization: Not on file    Attends meetings of clubs or organizations: Not on file    Relationship status: Not on file  . Intimate partner violence    Fear of current or ex partner: Not on file    Emotionally abused: Not on file    Physically abused: Not on file    Forced sexual activity: Not on file  Other Topics Concern  . Not on file  Social History Narrative   Lives with husband    Caffeine use: chocolate   No drinks.   Right handed      ROS:  General: Negative for anorexia, weight loss, fever, chills, fatigue, weakness. Eyes: Negative for vision changes.  ENT: Negative for hoarseness, difficulty swallowing , nasal congestion. CV: Negative for chest pain, angina, palpitations, dyspnea on exertion, peripheral edema.  Respiratory: Negative for dyspnea at rest, dyspnea on  exertion, cough, sputum, wheezing.  GI: See history of present illness. GU:  Negative for dysuria, hematuria, urinary incontinence, urinary frequency, nocturnal urination.  MS: Negative for joint pain, low back pain.  Derm: Negative for rash or itching.  Neuro: Negative for weakness, abnormal sensation, seizure,  memory loss, confusion.  Positive migraines.  History of Bell's palsy. Psych: Negative for anxiety, depression,  suicidal ideation, hallucinations.  Endo: Negative for unusual weight change.  Heme: Negative for bruising or bleeding. Allergy: Negative for rash or hives.    Physical Examination:  BP 98/66   Pulse 64   Temp (!) 96.7 F (35.9 C) (Temporal)   Ht 5\' 2"  (1.575 m)   Wt 164 lb 3.2 oz (74.5 kg)   BMI 30.03 kg/m    General: Well-nourished, well-developed in no acute distress.  Head: Normocephalic, atraumatic.   Eyes: Conjunctiva pink, no icterus.  Asymmetry due to Bell's palsy Mouth: masked Neck: Supple Lungs: Clear to auscultation bilaterally.  Heart: Regular rate and rhythm, no murmurs rubs or gallops.  Abdomen: Bowel sounds are normal, nontender, nondistended, no hepatosplenomegaly or masses, no abdominal bruits or    hernia , no rebound or guarding.   Rectal: Deferred Extremities: No lower extremity edema. No clubbing or deformities.  Neuro: Alert and oriented x 4 , grossly normal neurologically.  Skin: Warm and dry, no rash or jaundice.   Psych: Alert and cooperative, normal mood and affect.  Labs: Labs from 07/2019: TSH 1.2, white blood cell count 7600, hemoglobin 13, hematocrit 38.5, MCV 88, platelets 275,000, A1c 6.1, glucose 100, BUN 15, creatinine 0.93, albumin 4.1, total bilirubin less than 0.2, alkaline phosphatase 102, AST 24, ALT 26  Imaging Studies: No results found.

## 2019-09-22 NOTE — Assessment & Plan Note (Signed)
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  Chronically has intermittent diarrhea which she believes is related to previous cholecystectomy.  Tries to control with food avoidance.  Tends to have diarrhea sometimes with ocular migraine as well.  No prior colonoscopy.  No family history of colon cancer.  Discussed with patient.  We will screen for celiac disease with blood work.  Plan for colonoscopy in the near future with Dr.Rourk.  Doubt we are dealing with microscopic colitis given intermittent nature of her diarrhea but consider biopsies if appropriate.  I have discussed the risks, alternatives, benefits with regards to but not limited to the risk of reaction to medication, bleeding, infection, perforation and the patient is agreeable to proceed. Written consent to be obtained.

## 2019-09-23 ENCOUNTER — Other Ambulatory Visit: Payer: Self-pay

## 2019-09-23 ENCOUNTER — Telehealth: Payer: Self-pay

## 2019-09-23 DIAGNOSIS — R197 Diarrhea, unspecified: Secondary | ICD-10-CM

## 2019-09-23 DIAGNOSIS — K625 Hemorrhage of anus and rectum: Secondary | ICD-10-CM

## 2019-09-23 LAB — IGA: IgA/Immunoglobulin A, Serum: 372 mg/dL — ABNORMAL HIGH (ref 87–352)

## 2019-09-23 LAB — TISSUE TRANSGLUTAMINASE, IGA: Transglutaminase IgA: <2 U/mL (ref 0–3)

## 2019-09-23 MED ORDER — PEG 3350-KCL-NA BICARB-NACL 420 G PO SOLR: 4000.0000 mL | ORAL | 0 refills | Status: DC

## 2019-09-23 NOTE — Telephone Encounter (Signed)
Pt called office, TCS scheduled for 12/17/19 at 8:45am. COVID test 12/16/19 at 10:15am. Pt aware to quarantine at home after the test until procedure. Rx for prep sent to pharmacy. Orders entered. Appt letter mailed with procedure instructions.

## 2019-09-23 NOTE — Patient Instructions (Signed)
No PA needed for TCS per [facility] website if in network.

## 2019-09-23 NOTE — Telephone Encounter (Signed)
Tried to call pt to schedule TCS w/RMR, no answer, LMOVM for return call. 

## 2019-10-02 ENCOUNTER — Telehealth: Payer: Self-pay

## 2019-10-02 NOTE — Telephone Encounter (Signed)
Called pt to reschedule TCS w/RMR d/t RMR will not be available 12/17/19. TCS rescheduled to 12/03/19 at 8:45am. COVID test rescheduled to 12/02/19 at 11:00am. Endo scheduler informed. Appt letter mailed with new procedure instructions.

## 2019-11-17 ENCOUNTER — Telehealth: Payer: Self-pay | Admitting: *Deleted

## 2019-11-17 NOTE — Telephone Encounter (Signed)
Caryl Pina from [facility] called and said that they were out of her prep kit.  Informed her that we could do a Miralax prep.  Called pt and informed her that we would mail her out the new prep instructions.  Verbally instructed her on the OTC products to go ahead and purchase.  Pt voiced understanding.

## 2019-11-18 ENCOUNTER — Encounter: Payer: Self-pay | Admitting: *Deleted

## 2019-12-01 ENCOUNTER — Telehealth: Payer: Self-pay

## 2019-12-01 NOTE — Telephone Encounter (Signed)
Tia at [facility] called office, TCS 12/03/19 needs PA from [facility].  PA for TCS submitted via [facility] website. Case ID# [id number].

## 2019-12-02 ENCOUNTER — Other Ambulatory Visit: Payer: Self-pay

## 2019-12-02 ENCOUNTER — Other Ambulatory Visit (HOSPITAL_COMMUNITY)
Admission: RE | Admit: 2019-12-02 | Discharge: 2019-12-02 | Disposition: A | Discharge status: Home / Self Care | Payer: Commercial Managed Care - PPO | Source: Ambulatory Visit | Attending: Internal Medicine | Admitting: Internal Medicine

## 2019-12-02 DIAGNOSIS — Z01812 Encounter for preprocedural laboratory examination: Secondary | ICD-10-CM | POA: Insufficient documentation

## 2019-12-02 DIAGNOSIS — Z20822 Contact with and (suspected) exposure to covid-19: Secondary | ICD-10-CM | POA: Diagnosis not present

## 2019-12-02 LAB — SARS CORONAVIRUS 2 (TAT 6-24 HRS): SARS Coronavirus 2: NEGATIVE

## 2019-12-02 NOTE — Telephone Encounter (Signed)
Per [employer] website, PA case status: not required.

## 2019-12-03 ENCOUNTER — Encounter (HOSPITAL_COMMUNITY)
Admission: RE | Disposition: A | Discharge status: Home / Self Care | Payer: Self-pay | Source: Home / Self Care | Attending: Internal Medicine

## 2019-12-03 ENCOUNTER — Ambulatory Visit (HOSPITAL_COMMUNITY)
Admission: RE | Admit: 2019-12-03 | Discharge: 2019-12-03 | Disposition: A | Discharge status: Home / Self Care | Payer: Commercial Managed Care - PPO | Attending: Internal Medicine | Admitting: Internal Medicine

## 2019-12-03 ENCOUNTER — Other Ambulatory Visit: Payer: Self-pay

## 2019-12-03 ENCOUNTER — Encounter (HOSPITAL_COMMUNITY): Payer: Self-pay | Admitting: Internal Medicine

## 2019-12-03 DIAGNOSIS — Z79899 Other long term (current) drug therapy: Secondary | ICD-10-CM | POA: Insufficient documentation

## 2019-12-03 DIAGNOSIS — K921 Melena: Secondary | ICD-10-CM | POA: Diagnosis not present

## 2019-12-03 DIAGNOSIS — K529 Noninfective gastroenteritis and colitis, unspecified: Secondary | ICD-10-CM | POA: Diagnosis present

## 2019-12-03 DIAGNOSIS — I10 Essential (primary) hypertension: Secondary | ICD-10-CM | POA: Insufficient documentation

## 2019-12-03 DIAGNOSIS — Z86711 Personal history of pulmonary embolism: Secondary | ICD-10-CM | POA: Diagnosis not present

## 2019-12-03 DIAGNOSIS — K625 Hemorrhage of anus and rectum: Secondary | ICD-10-CM | POA: Diagnosis present

## 2019-12-03 DIAGNOSIS — K649 Unspecified hemorrhoids: Secondary | ICD-10-CM | POA: Insufficient documentation

## 2019-12-03 DIAGNOSIS — R197 Diarrhea, unspecified: Secondary | ICD-10-CM

## 2019-12-03 DIAGNOSIS — Z833 Family history of diabetes mellitus: Secondary | ICD-10-CM | POA: Insufficient documentation

## 2019-12-03 DIAGNOSIS — Z8249 Family history of ischemic heart disease and other diseases of the circulatory system: Secondary | ICD-10-CM | POA: Diagnosis not present

## 2019-12-03 HISTORY — PX: COLONOSCOPY: SHX5424

## 2019-12-03 HISTORY — PX: BIOPSY: SHX5522

## 2019-12-03 SURGERY — COLONOSCOPY
Anesthesia: Moderate Sedation

## 2019-12-03 MED ORDER — MIDAZOLAM HCL 5 MG/5ML IJ SOLN
INTRAMUSCULAR | Status: AC
  Filled 2019-12-03: qty 10

## 2019-12-03 MED ORDER — MIDAZOLAM HCL 5 MG/5ML IJ SOLN
INTRAMUSCULAR | Status: DC | PRN
  Administered 2019-12-03 (×2): 2 mg via INTRAVENOUS
  Administered 2019-12-03: 1 mg via INTRAVENOUS

## 2019-12-03 MED ORDER — ONDANSETRON HCL 4 MG/2ML IJ SOLN
INTRAMUSCULAR | Status: DC | PRN
  Administered 2019-12-03: 4 mg via INTRAVENOUS

## 2019-12-03 MED ORDER — STERILE WATER FOR IRRIGATION IR SOLN: Status: DC | PRN

## 2019-12-03 MED ORDER — MEPERIDINE HCL 50 MG/ML IJ SOLN
INTRAMUSCULAR | Status: AC
  Filled 2019-12-03: qty 1

## 2019-12-03 MED ORDER — SODIUM CHLORIDE 0.9 % IV SOLN
INTRAVENOUS | Status: DC
  Administered 2019-12-03: 1000 mL via INTRAVENOUS

## 2019-12-03 MED ORDER — MEPERIDINE HCL 100 MG/ML IJ SOLN
INTRAMUSCULAR | Status: DC | PRN
  Administered 2019-12-03: 15 mg
  Administered 2019-12-03: 25 mg

## 2019-12-03 MED ORDER — ONDANSETRON HCL 4 MG/2ML IJ SOLN
INTRAMUSCULAR | Status: AC
  Filled 2019-12-03: qty 2

## 2019-12-03 NOTE — H&P (Addendum)
@LOGO @   Primary Care Physician:  , PA-C Primary Gastroenterologist:  Dr. Lianne Moris  Pre-Procedure History & Physical: HPI:  Patricia Silva is a 54 y.o. female here for diagnostic colonoscopy.  Rectal bleeding chronic diarrhea.  Past Medical History:  Diagnosis Date  . History of Bell's palsy   . History of pneumonia   . HTN (hypertension)   . Migraines   . Migraines   . Personal history of PE (pulmonary embolism) 2011  . Vertigo     Past Surgical History:  Procedure Laterality Date  . APPENDECTOMY  2001  . CHOLECYSTECTOMY    . DILATION AND CURETTAGE OF UTERUS  1984  . LEFT HEART CATHETERIZATION WITH CORONARY ANGIOGRAM N/A 12/08/2013   Procedure: LEFT HEART CATHETERIZATION WITH CORONARY ANGIOGRAM;  Surgeon: 12/10/2013, MD;  Location: [facility] CATH LAB;  Service: Cardiovascular;  Laterality: N/A;  . PARTIAL HYSTERECTOMY  2009   vaginal    Prior to Admission medications   Medication Sig Start Date End Date Taking? Authorizing Provider  aspirin-acetaminophen-caffeine (EXCEDRIN MIGRAINE) [phone] MG tablet Take 2 tablets by mouth every 6 (six) hours as needed for headache.   Yes [provider]  Calcium Carbonate Antacid (TUMS PO) Take by mouth as needed.   Yes [provider]  ibuprofen (ADVIL,MOTRIN) 200 MG tablet Take 800 mg by mouth as needed.   Yes [provider]  lisinopril (PRINIVIL,ZESTRIL) 10 MG tablet Take 10 mg by mouth at bedtime.  12/14/18  Yes [provider]  loratadine (CLARITIN) 10 MG tablet Take 10 mg by mouth daily.   Yes [provider]  propranolol (INDERAL) 60 MG tablet Take 60 mg by mouth at bedtime.    Yes [provider]  Pseudoephedrine HCl (SUDOGEST PO) Take by mouth as needed.   Yes [provider]  tiZANidine (ZANAFLEX) 4 MG tablet Two tabs at onset of headache and two more if needed    Yes [provider]  acetaminophen (TYLENOL) 325 MG tablet Take 1,300 mg by mouth  every 6 (six) hours as needed for mild pain or headache.    [provider]  Melatonin 10 MG TABS Take 20 mg by mouth at bedtime.     [provider]  polyethylene glycol-electrolytes (TRILYTE) 420 g solution Take 4,000 mLs by mouth as directed. 09/23/19   Neyda Durango, 14/8/20, MD  zolpidem (AMBIEN CR) 12.5 MG CR tablet Take 12.5 mg by mouth at bedtime as needed for sleep.    [provider]    Allergies as of 09/23/2019 - Review Complete 09/22/2019  Allergen Reaction Noted  . Imitrex [sumatriptan base] Anaphylaxis and Other (See Comments) 10/03/2011  . Aimovig [erenumab-aooe]  01/02/2019  . Codeine  08/18/2016  . Morphine and related  10/03/2011    Family History  Problem Relation Age of Onset  . Pulmonary embolism Mother   . Diabetes Mother   . Deep vein thrombosis Mother   . Clotting disorder Mother   . High blood pressure Mother   . Migraines Mother   . Heart disease Father   . Prostate cancer Father   . COPD Father   . High blood pressure Father   . Migraines Brother   . Heart disease Other   . Arthritis Other   . Lung disease Other   . Cancer Other   . Migraines Sister   . Pulmonary embolism Maternal Aunt   . Migraines Daughter   . Migraines Daughter   . Migraines Son   .  Pulmonary embolism Cousin   . Pulmonary embolism Maternal Uncle   . Colon cancer Neg Hx     Social History   Socioeconomic History  . Marital status: Married    Spouse name: Not on file  . Number of children: 4  . Years of education: Not on file  . Highest education level: Associate degree: academic program  Occupational History  . Occupation: Nurse  Tobacco Use  . Smoking status: Never Smoker  . Smokeless tobacco: Never Used  Substance and Sexual Activity  . Alcohol use: Not Currently  . Drug use: No  . Sexual activity: Not on file  Other Topics Concern  . Not on file  Social History Narrative   Lives with husband    Caffeine use: chocolate   No drinks.    Right handed   Social Determinants of Health   Financial Resource Strain:   . Difficulty of Paying Living Expenses: Not on file  Food Insecurity:   . Worried About Charity fundraiser in the Last Year: Not on file  . Ran Out of Food in the Last Year: Not on file  Transportation Needs:   . Lack of Transportation (Medical): Not on file  . Lack of Transportation (Non-Medical): Not on file  Physical Activity:   . Days of Exercise per Week: Not on file  . Minutes of Exercise per Session: Not on file  Stress:   . Feeling of Stress : Not on file  Social Connections:   . Frequency of Communication with Friends and Family: Not on file  . Frequency of Social Gatherings with Friends and Family: Not on file  . Attends Religious Services: Not on file  . Active Member of Clubs or Organizations: Not on file  . Attends Archivist Meetings: Not on file  . Marital Status: Not on file  Intimate Partner Violence:   . Fear of Current or Ex-Partner: Not on file  . Emotionally Abused: Not on file  . Physically Abused: Not on file  . Sexually Abused: Not on file    Review of Systems: See HPI, otherwise negative ROS  Physical Exam: BP 134/72   Pulse (!) 58   Temp (!) 97.4 F (36.3 C) (Oral)   Resp 14   Ht 5\' 2"  (1.575 m)   Wt 70.8 kg   SpO2 99%   BMI 28.53 kg/m  General:   Alert,  Well-developed, well-nourished, pleasant and cooperative in NAD Neck:  Supple; no masses or thyromegaly. No significant cervical adenopathy. Lungs:  Clear throughout to auscultation.   No wheezes, crackles, or rhonchi. No acute distress. Heart:  Regular rate and rhythm; no murmurs, clicks, rubs,  or gallops. Abdomen: Non-distended, normal bowel sounds.  Soft and nontender without appreciable mass or hepatosplenomegaly.  Pulses:  Normal pulses noted. Extremities:  Without clubbing or edema.  Impression/Plan: 54 year old lady with chronic diarrhea and intermittent rectal bleeding here for first ever  colonoscopy per plan.  The risks, benefits, limitations, alternatives and imponderables have been reviewed with the patient. Questions have been answered. All parties are agreeable.      Notice: This dictation was prepared with Dragon dictation along with smaller phrase technology. Any transcriptional errors that result from this process are unintentional and may not be corrected upon review.

## 2019-12-03 NOTE — Discharge Instructions (Signed)
Colonoscopy Discharge Instructions  Read the instructions outlined below and refer to this sheet in the next few weeks. These discharge instructions provide you with general information on caring for yourself after you leave the hospital. Your doctor may also give you specific instructions. While your treatment has been planned according to the most current medical practices available, unavoidable complications occasionally occur. If you have any problems or questions after discharge, call Dr. Gala Romney at [phone]. ACTIVITY  You may resume your regular activity, but move at a slower pace for the next 24 hours.   Take frequent rest periods for the next 24 hours.   Walking will help get rid of the air and reduce the bloated feeling in your belly (abdomen).   No driving for 24 hours (because of the medicine (anesthesia) used during the test).    Do not sign any important legal documents or operate any machinery for 24 hours (because of the anesthesia used during the test).  NUTRITION  Drink plenty of fluids.   You may resume your normal diet as instructed by your doctor.   Begin with a light meal and progress to your normal diet. Heavy or fried foods are harder to digest and may make you feel sick to your stomach (nauseated).   Avoid alcoholic beverages for 24 hours or as instructed.  MEDICATIONS  You may resume your normal medications unless your doctor tells you otherwise.  WHAT YOU CAN EXPECT TODAY  Some feelings of bloating in the abdomen.   Passage of more gas than usual.   Spotting of blood in your stool or on the toilet paper.  IF YOU HAD POLYPS REMOVED DURING THE COLONOSCOPY:  No aspirin products for 7 days or as instructed.   No alcohol for 7 days or as instructed.   Eat a soft diet for the next 24 hours.  FINDING OUT THE RESULTS OF YOUR TEST Not all test results are available during your visit. If your test results are not back during the visit, make an appointment  with your caregiver to find out the results. Do not assume everything is normal if you have not heard from your caregiver or the medical facility. It is important for you to follow up on all of your test results.  SEEK IMMEDIATE MEDICAL ATTENTION IF:  You have more than a spotting of blood in your stool.   Your belly is swollen (abdominal distention).   You are nauseated or vomiting.   You have a temperature over 101.   You have abdominal pain or discomfort that is severe or gets worse throughout the day.    Hemorrhoids Hemorrhoids are swollen veins that may develop:  In the butt (rectum). These are called internal hemorrhoids.  Around the opening of the butt (anus). These are called external hemorrhoids. Hemorrhoids can cause pain, itching, or bleeding. Most of the time, they do not cause serious problems. They usually get better with diet changes, lifestyle changes, and other home treatments. What are the causes? This condition may be caused by:  Having trouble pooping (constipation).  Pushing hard (straining) to poop.  Watery poop (diarrhea).  Pregnancy.  Being very overweight (obese).  Sitting for long periods of time.  Heavy lifting or other activity that causes you to strain.  Anal sex.  Riding a bike for a long period of time. What are the signs or symptoms? Symptoms of this condition include:  Pain.  Itching or soreness in the butt.  Bleeding from the butt.  Leaking poop.  Swelling in the area.  One or more lumps around the opening of your butt. How is this diagnosed? A doctor can often diagnose this condition by looking at the affected area. The doctor may also:  Do an exam that involves feeling the area with a gloved hand (digital rectal exam).  Examine the area inside your butt using a small tube (anoscope).  Order blood tests. This may be done if you have lost a lot of blood.  Have you get a test that involves looking inside the colon using  a flexible tube with a camera on the end (sigmoidoscopy or colonoscopy). How is this treated? This condition can usually be treated at home. Your doctor may tell you to change what you eat, make lifestyle changes, or try home treatments. If these do not help, procedures can be done to remove the hemorrhoids or make them smaller. These may involve:  Placing rubber bands at the base of the hemorrhoids to cut off their blood supply.  Injecting medicine into the hemorrhoids to shrink them.  Shining a type of light energy onto the hemorrhoids to cause them to fall off.  Doing surgery to remove the hemorrhoids or cut off their blood supply. Follow these instructions at home: Eating and drinking   Eat foods that have a lot of fiber in them. These include whole grains, beans, nuts, fruits, and vegetables.  Ask your doctor about taking products that have added fiber (fibersupplements).  Reduce the amount of fat in your diet. You can do this by: ? Eating low-fat dairy products. ? Eating less red meat. ? Avoiding processed foods.  Drink enough fluid to keep your pee (urine) pale yellow. Managing pain and swelling   Take a warm-water bath (sitz bath) for 20 minutes to ease pain. Do this 3-4 times a day. You may do this in a bathtub or using a portable sitz bath that fits over the toilet.  If told, put ice on the painful area. It may be helpful to use ice between your warm baths. ? Put ice in a plastic bag. ? Place a towel between your skin and the bag. ? Leave the ice on for 20 minutes, 2-3 times a day. General instructions  Take over-the-counter and prescription medicines only as told by your doctor. ? Medicated creams and medicines may be used as told.  Exercise often. Ask your doctor how much and what kind of exercise is best for you.  Go to the bathroom when you have the urge to poop. Do not wait.  Avoid pushing too hard when you poop.  Keep your butt dry and clean. Use wet  toilet paper or moist towelettes after pooping.  Do not sit on the toilet for a long time.  Keep all follow-up visits as told by your doctor. This is important. Contact a doctor if you:  Have pain and swelling that do not get better with treatment or medicine.  Have trouble pooping.  Cannot poop.  Have pain or swelling outside the area of the hemorrhoids. Get help right away if you have:  Bleeding that will not stop. Summary  Hemorrhoids are swollen veins in the butt or around the opening of the butt.  They can cause pain, itching, or bleeding.  Eat foods that have a lot of fiber in them. These include whole grains, beans, nuts, fruits, and vegetables.  Take a warm-water bath (sitz bath) for 20 minutes to ease pain. Do this 3-4 times a  day. This information is not intended to replace advice given to you by your health care provider. Make sure you discuss any questions you have with your health care provider. Document Revised: 10/10/2018 Document Reviewed: 02/21/2018 Elsevier Patient Education  [employer].   Repeat colonoscopy in 10 years for screening purposes  Hemorrhoid information provided.  Hemorrhoid pamphlet banding information provided  Follow-up office visit with [place] (AB) in 6 weeks  Further recommendations to follow pending review of pathology report  At patient request, spoke to Mickeal Needy at [phone]

## 2019-12-03 NOTE — Op Note (Signed)
[facility] Patient Name: Patricia Silva Procedure Date: 12/03/2019 8:13 AM MRN: [id number] Date of Birth: 1965-11-16 Attending MD: Norvel Richards , MD CSN: [id number] Age: 54 Admit Type: Outpatient Procedure:                Colonoscopy Indications:              Chronic diarrhea, Hematochezia Providers:                Norvel Richards, MD, Janeece Riggers, RN, Raphael Gibney, Technician Referring MD:              Medicines:                Midazolam 5 mg IV, Meperidine 40 mg IV Complications:            No immediate complications. Estimated Blood Loss:     Estimated blood loss: none. Procedure:                After obtaining informed consent, the colonoscope                            was passed under direct vision. Throughout the                            procedure, the patient's blood pressure, pulse, and                            oxygen saturations were monitored continuously. The                            CF-[id number] ([id number]) scope was introduced through                            the anus and advanced to the 5 cm into the ileum. Scope In: 8:55:04 AM Scope Out: 9:10:11 AM Scope Withdrawal Time: 0 hours 7 minutes 7 seconds  Total Procedure Duration: 0 hours 15 minutes 7 seconds  Findings:      The perianal and digital rectal examinations were normal. Grade 2       hemorrhoids present. Rectal vault too small to retroflex. Seen well       on?"face. Distal 5 cm of terminal ileum appeared normal. Segmental       biopsies taken. Impression:               - The entire examined colon is normal. Hemorrhoids                            -likely source of hematochezia. Status post                            segmental biopsy.                           - Moderate Sedation:      Moderate (conscious) sedation was administered by the endoscopy nurse       and supervised by the  endoscopist. The following parameters were       monitored: oxygen saturation,  heart rate, blood pressure, respiratory       rate, EKG, adequacy of pulmonary ventilation, and response to care.       Total physician intraservice time was 21 minutes. Recommendation:           - Patient has a contact number available for                            emergencies. The signs and symptoms of potential                            delayed complications were discussed with the                            patient. Return to normal activities tomorrow.                            Written discharge instructions were provided to the                            patient.                           - Advance diet as tolerated. Follow-up on                            pathology. Office visit 6 weeks. Pamphlet on                            hemorrhoid banding provided. Procedure Code(s):        --- Professional ---                           [phone], Colonoscopy, flexible; diagnostic, including                            collection of specimen(s) by brushing or washing,                            when performed (separate procedure)                           G0500, Moderate sedation services provided by the                            same physician or other qualified health care                            professional performing a gastrointestinal                            endoscopic service that sedation supports,                            requiring the presence of an independent trained  observer to assist in the monitoring of the                            patient's level of consciousness and physiological                            status; initial 15 minutes of intra-service time;                            patient age 21 years or older (additional time may                            be reported with [id number], as appropriate) Diagnosis Code(s):        --- Professional ---                           K52.9, Noninfective gastroenteritis and colitis,                             unspecified                           K92.1, Melena (includes Hematochezia) CPT copyright 2019 American Medical Association. All rights reserved. The codes documented in this report are preliminary and upon coder review may  be revised to meet current compliance requirements. Gerrit Friends. Adylene Dlugosz, MD Gennette Pac, MD 12/03/2019 9:41:20 AM This report has been signed electronically. Number of Addenda: 0

## 2019-12-04 ENCOUNTER — Encounter: Payer: Self-pay | Admitting: Internal Medicine

## 2019-12-04 LAB — SURGICAL PATHOLOGY

## 2019-12-16 ENCOUNTER — Other Ambulatory Visit (HOSPITAL_COMMUNITY): Payer: Commercial Managed Care - PPO

## 2019-12-24 ENCOUNTER — Encounter: Payer: Self-pay | Admitting: Gastroenterology

## 2020-01-14 ENCOUNTER — Ambulatory Visit: Payer: Commercial Managed Care - PPO | Admitting: Gastroenterology

## 2020-01-15 ENCOUNTER — Ambulatory Visit: Payer: Commercial Managed Care - PPO | Admitting: Gastroenterology

## 2020-01-22 ENCOUNTER — Ambulatory Visit: Payer: Commercial Managed Care - PPO | Admitting: Gastroenterology

## 2022-05-16 ENCOUNTER — Encounter: Payer: Self-pay | Admitting: Neurology

## 2022-05-16 ENCOUNTER — Ambulatory Visit (INDEPENDENT_AMBULATORY_CARE_PROVIDER_SITE_OTHER): Payer: Commercial Managed Care - PPO | Admitting: Neurology

## 2022-05-16 VITALS — BP 143/81 | HR 53 | Ht 62.0 in | Wt 159.4 lb

## 2022-05-16 DIAGNOSIS — R519 Headache, unspecified: Secondary | ICD-10-CM | POA: Diagnosis not present

## 2022-05-16 DIAGNOSIS — G43009 Migraine without aura, not intractable, without status migrainosus: Secondary | ICD-10-CM

## 2022-05-16 DIAGNOSIS — G4719 Other hypersomnia: Secondary | ICD-10-CM

## 2022-05-16 DIAGNOSIS — G8929 Other chronic pain: Secondary | ICD-10-CM

## 2022-05-16 DIAGNOSIS — R51 Headache with orthostatic component, not elsewhere classified: Secondary | ICD-10-CM

## 2022-05-16 DIAGNOSIS — H547 Unspecified visual loss: Secondary | ICD-10-CM

## 2022-05-16 DIAGNOSIS — R5383 Other fatigue: Secondary | ICD-10-CM

## 2022-05-16 DIAGNOSIS — R0683 Snoring: Secondary | ICD-10-CM

## 2022-05-16 MED ORDER — UBRELVY 100 MG PO TABS: 100.0000 mg | ORAL_TABLET | ORAL | 11 refills | Status: DC | PRN

## 2022-05-16 MED ORDER — QULIPTA 60 MG PO TABS: 60.0000 mg | ORAL_TABLET | Freq: Every day | ORAL | 11 refills | Status: DC

## 2022-05-16 NOTE — Patient Instructions (Addendum)
MRI of the brain w/wo contrast Sleep study Start Qulipta Daily at prevention Ubrelvy as needed  Ubrogepant Tablets What is this medication? UBROGEPANT (ue BROE je pant) treats migraines. It works by blocking a substance in the body that causes migraines. It is not used to prevent migraines. This medicine may be used for other purposes; ask your health care provider or pharmacist if you have questions. COMMON BRAND NAME(S): Bernita Raisin What should I tell my care team before I take this medication? They need to know if you have any of these conditions: Kidney disease Liver disease An unusual or allergic reaction to ubrogepant, other medications, foods, dyes, or preservatives Pregnant or trying to get pregnant Breast-feeding How should I use this medication? Take this medication by mouth with a glass of water. Take it as directed on the prescription label. You can take it with or without food. If it upsets your stomach, take it with food. Keep taking it unless your care team tells you to stop. Talk to your care team about the use of this medication in children. Special care may be needed. Overdosage: If you think you have taken too much of this medicine contact a poison control center or emergency room at once. NOTE: This medicine is only for you. Do not share this medicine with others. What if I miss a dose? This does not apply. This medication is not for regular use. What may interact with this medication? Do not take this medication with any of the following: Adagrasib Ceritinib Certain antibiotics, such as chloramphenicol, clarithromycin, telithromycin Certain antivirals for HIV, such as atazanavir, cobicistat, darunavir, delavirdine, fosamprenavir, indinavir, ritonavir Certain medications for fungal infections, such as itraconazole, ketoconazole, posaconazole, voriconazole Conivaptan Grapefruit Idelalisib Mifepristone Nefazodone Ribociclib This medication may also interact with the  following: Carvedilol Certain medications for seizures, such as phenobarbital, phenytoin Ciprofloxacin Cyclosporine Eltrombopag Fluconazole Fluvoxamine Quinidine Rifampin St. John's wort Verapamil This list may not describe all possible interactions. Give your health care provider a list of all the medicines, herbs, non-prescription drugs, or dietary supplements you use. Also tell them if you smoke, drink alcohol, or use illegal drugs. Some items may interact with your medicine. What should I watch for while using this medication? Visit your care team for regular checks on your progress. Tell your care team if your symptoms do not start to get better or if they get worse. Your mouth may get dry. Chewing sugarless gum or sucking hard candy and drinking plenty of water may help. Contact your care team if the problem does not go away or is severe. What side effects may I notice from receiving this medication? Side effects that you should report to your care team as soon as possible: Allergic reactions--skin rash, itching, hives, swelling of the face, lips, tongue, or throat Side effects that usually do not require medical attention (report to your care team if they continue or are bothersome): Drowsiness Dry mouth Fatigue Nausea This list may not describe all possible side effects. Call your doctor for medical advice about side effects. You may report side effects to FDA at 1-800-FDA-1088. Where should I keep my medication? Keep out of the reach of children and pets. Store between 15 and 30 degrees C (59 and 86 degrees F). Get rid of any unused medication after the expiration date. To get rid of medications that are no longer needed or have expired: Take the medication to a medication take-back program. Check with your pharmacy or law enforcement to find a location.  If you cannot return the medication, check the label or package insert to see if the medication should be thrown out in the  garbage or flushed down the toilet. If you are not sure, ask your care team. If it is safe to put it in the trash, pour the medication out of the container. Mix the medication with cat litter, dirt, coffee grounds, or other unwanted substance. Seal the mixture in a bag or container. Put it in the trash. NOTE: This sheet is a summary. It may not cover all possible information. If you have questions about this medicine, talk to your doctor, pharmacist, or health care provider.  2023 Elsevier/Gold Standard (2021-11-25 00:00:00) Atogepant Tablets What is this medication? ATOGEPANT (a TOE je pant) prevents migraines. It works by blocking a substance in the body that causes migraines. This medicine may be used for other purposes; ask your health care provider or pharmacist if you have questions. COMMON BRAND NAME(S): QULIPTA What should I tell my care team before I take this medication? They need to know if you have any of these conditions: Kidney disease Liver disease An unusual or allergic reaction to atogepant, other medications, foods, dyes, or preservatives Pregnant or trying to get pregnant Breast-feeding How should I use this medication? Take this medication by mouth with water. Take it as directed on the prescription label at the same time every day. You can take it with or without food. If it upsets your stomach, take it with food. Keep taking it unless your care team tells you to stop. Talk to your care team about the use of this medication in children. Special care may be needed. Overdosage: If you think you have taken too much of this medicine contact a poison control center or emergency room at once. NOTE: This medicine is only for you. Do not share this medicine with others. What if I miss a dose? If you miss a dose, take it as soon as you can. If it is almost time for your next dose, take only that dose. Do not take double or extra doses. What may interact with this  medication? Carbamazepine Certain medications for fungal infections, such as itraconazole, ketoconazole Clarithromycin Cyclosporine Efavirenz Etravirine Phenytoin Rifampin St. John's wort This list may not describe all possible interactions. Give your health care provider a list of all the medicines, herbs, non-prescription drugs, or dietary supplements you use. Also tell them if you smoke, drink alcohol, or use illegal drugs. Some items may interact with your medicine. What should I watch for while using this medication? Visit your care team for regular checks on your progress. Tell your care team if your symptoms do not start to get better or if they get worse. What side effects may I notice from receiving this medication? Side effects that you should report to your care team as soon as possible: Allergic reactions--skin rash, itching, hives, swelling of the face, lips, tongue, or throat Side effects that usually do not require medical attention (report to your care team if they continue or are bothersome): Constipation Fatigue Loss of appetite with weight loss Nausea This list may not describe all possible side effects. Call your doctor for medical advice about side effects. You may report side effects to FDA at 1-800-FDA-1088. Where should I keep my medication? Keep out of the reach of children and pets. Store at room temperature between 20 and 25 degrees C (68 and 77 degrees F). Get rid of any unused medication after the expiration  date. To get rid of medications that are no longer needed or have expired: Take the medication to a medication take-back program. Check with your pharmacy or law enforcement to find a location. If you cannot return the medication, check the label or package insert to see if the medication should be thrown out in the garbage or flushed down the toilet. If you are not sure, ask your care team. If it is safe to put it in the trash, take the medication out of  the container. Mix the medication with cat litter, dirt, coffee grounds, or other unwanted substance. Seal the mixture in a bag or container. Put it in the trash. NOTE: This sheet is a summary. It may not cover all possible information. If you have questions about this medicine, talk to your doctor, pharmacist, or health care provider.  2023 Elsevier/Gold Standard (2021-11-21 00:00:00)

## 2022-05-16 NOTE — Progress Notes (Signed)
[facility]    Provider:  Dr Lucia Gaskins Requesting Provider: Lianne Moris, PA-C Primary Care Provider:  Lianne Moris, PA-C  CC:  migraines  HPI:  Patricia Silva is a 56 y.o. female here as requested by Lianne Moris, PA-C for migraines. Here with husband who also provides information.  She has a past medical history of hypertension, migraines, Bell's palsy.  We saw her in the past in 2019 and last saw her in April 2020.  I reviewed my notes it appears we tried her on Aimovig and Ajovy.  Aimovig worked great but gave her severe constipation.  Then we gave her Ajovy and we have not seen her since April 2020 at that time she was not interested in Botox and it does not appear as though she ever did get it.  [facility] did [facility], Lianne Moris, she presented with headache, located diffusely, acute and worsening, states the symptoms started 6 months ago but we did see her again in 2019 for migraines.  Examination was normal.  Patient states Migraines started at the age of 61. The last time we saw her the aimovig worked great but gave her bad constipation, Ajovy didn't help at all. Her migraines are pulsating/pounding/throbbing, can be unilatreal or bilateral, nausea, phono/photophobia, nausea. Movement makes it worse. She has vomited in a while. Daily headaches. She has had several MRIs of the brain but none for years. But migraines are worsening, wake with them, worse supine in the morning. She works at hospice. 6 moderate to severe migraines a month that can last 12-72 hours. <14 headache days a mpnth. No aura. affecting her life, blurry vision, she has lost vision, extreme light sensitivity. We were discussing botox when the pandemic hit. Her migraine started at the age of 45.She has morning headaches. She is very tired. Discussed sleep apnea, wakes up a lot. +snoring. No other focal neurologic deficits, associated symptoms, inciting events or modifiable  factors.   Reviewed notes, labs and imaging from outside physicians, which showed:  From a thorough review of records, she has tried propranolol, lisinopril, topiramate, amitriptyline/nortriptyline, Tylenol, aspirin, diclofenac, Aimovig, Ajovy, ibuprofen, meclizine, metoprolol, Zofran, prednisone, Phenergan, verapamil, Botox? Triptans contraindicated: Had bad reaction to imitrex, maxalt with chest tightening but no anaphylaxis and tachycardia and throat tightening  Review of Systems: Patient complains of symptoms per HPI as well as the following symptoms headaches. Pertinent negatives and positives per HPI. All others negative.   Social History   Socioeconomic History   Marital status: Married    Spouse name: Not on file   Number of children: 4   Years of education: Not on file   Highest education level: Associate degree: academic program  Occupational History   Occupation: Nurse  Tobacco Use   Smoking status: Never   Smokeless tobacco: Never  Vaping Use   Vaping Use: Never used  Substance and Sexual Activity   Alcohol use: Not Currently   Drug use: No   Sexual activity: Not on file  Other Topics Concern   Not on file  Social History Narrative   Lives with husband    Caffeine use: chocolate   No drinks.   Right handed   Social Determinants of Health   Financial Resource Strain: Not on file  Food Insecurity: Not on file  Transportation Needs: Not on file  Physical Activity: Not on file  Stress: Not on file  Social Connections: Not on file  Intimate Partner Violence: Not on file  Family History  Problem Relation Age of Onset   Pulmonary embolism Mother    Diabetes Mother    Deep vein thrombosis Mother    Clotting disorder Mother    High blood pressure Mother    Migraines Mother    Heart disease Father    Prostate cancer Father    COPD Father    High blood pressure Father    Migraines Brother    Heart disease Other    Arthritis Other    Lung disease  Other    Cancer Other    Migraines Sister    Pulmonary embolism Maternal Aunt    Migraines Daughter    Migraines Daughter    Migraines Son    Pulmonary embolism Cousin    Pulmonary embolism Maternal Uncle    Colon cancer Neg Hx     Past Medical History:  Diagnosis Date   History of Bell's palsy    History of pneumonia    HTN (hypertension)    Migraines    Migraines    Personal history of PE (pulmonary embolism) 2011   Vertigo     Patient Active Problem List   Diagnosis Date Noted   Rectal bleeding 09/22/2019   Diarrhea 09/22/2019   Chronic migraine without aura, with intractable migraine, so stated, with status migrainosus 08/18/2016   Unstable angina (HCC) 12/06/2013   Chest pain 12/05/2013   History of pulmonary embolism 12/05/2013   History of Bell's palsy 12/05/2013   Migraines    HTN (hypertension)    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   Rotator cuff syndrome of left shoulder 10/16/2011    Past Surgical History:  Procedure Laterality Date   APPENDECTOMY  2001   BIOPSY  12/03/2019   Procedure: BIOPSY;  Surgeon: Corbin Ade, MD;  Location: [facility];  Service: Endoscopy;;   CHOLECYSTECTOMY     COLONOSCOPY N/A 12/03/2019   Procedure: COLONOSCOPY;  Surgeon: Corbin Ade, MD;  Location: [facility];  Service: Endoscopy;  Laterality: N/A;  8:45am   DILATION AND CURETTAGE OF UTERUS  1984   LEFT HEART CATHETERIZATION WITH CORONARY ANGIOGRAM N/A 12/08/2013   Procedure: LEFT HEART CATHETERIZATION WITH CORONARY ANGIOGRAM;  Surgeon: Corky Crafts, MD;  Location[facility] C[facility] LAB;  Service: Cardiovascular;  Laterality: N/A;   PARTIAL HYSTERECTOMY  2009   vaginal    Current Outpatient Medications  Medication Sig Dispense Refill   acetaminophen (TYLENOL) 325 MG tablet Take 1,300 mg by mouth every 6 (six) hours as needed for mild pain or headache.     aspirin-acetaminophen-caffeine (EXCEDRIN MIGRAINE) 250-250-65 MG tablet Take 2 tablets by mouth every 6 (six)  hours as needed for headache.     Atogepant (QULIPTA) 60 MG TABS Take 60 mg by mouth daily. 30 tablet 11   Calcium Carbonate Antacid (TUMS PO) Take by mouth as needed.     ibuprofen (ADVIL,MOTRIN) 200 MG tablet Take 800 mg by mouth as needed.     propranolol (INDERAL) 60 MG tablet Take 60 mg by mouth at bedtime.      Pseudoephedrine HCl (SUDOGEST PO) Take by mouth as needed.     tiZANidine (ZANAFLEX) 4 MG tablet Two tabs at onset of headache and two more if needed      topiramate (TOPAMAX) 50 MG tablet Take 75 mg by mouth at bedtime.     Ubrogepant (UBRELVY) 100 MG TABS Take 100 mg by mouth every 2 (two) hours as needed. Maximum 200mg  a day. 16 tablet 11  No current facility-administered medications for this visit.    Allergies as of 05/16/2022 - Review Complete 05/16/2022  Allergen Reaction Noted   Imitrex [sumatriptan base] Anaphylaxis and Other (See Comments) 10/03/2011   Morphine and related Shortness Of Breath and Itching 10/03/2011   Ambien [zolpidem tartrate] Other (See Comments) 12/03/2019   Aimovig [erenumab-aooe]  01/02/2019   Codeine Palpitations 08/18/2016    Vitals: BP (!) 143/81   Pulse (!) 53   Ht 5\' 2"  (1.575 m)   Wt 159 lb 6.4 oz (72.3 kg)   BMI 29.15 kg/m  Last Weight:  Wt Readings from Last 1 Encounters:  05/16/22 159 lb 6.4 oz (72.3 kg)   Last Height:   Ht Readings from Last 1 Encounters:  05/16/22 5\' 2"  (1.575 m)     Physical exam: Exam: Gen: NAD, conversant, well nourised, obese, well groomed                     CV: RRR, no MRG. No Carotid Bruits. No peripheral edema, warm, nontender Eyes: Conjunctivae clear without exudates or hemorrhage  Neuro: Detailed Neurologic Exam  Speech:    Speech is normal; fluent and spontaneous with normal comprehension.  Cognition:    The patient is oriented to person, place, and time;     recent and remote memory intact;     language fluent;     normal attention, concentration,     fund of knowledge Cranial  Nerves:    The pupils are equal, round, and reactive to light. Pupils too small to visualize fundi.. Visual fields are full to finger confrontation. Extraocular movements are intact. Trigeminal sensation is intact and the muscles of mastication are normal. The face is symmetric. The palate elevates in the midline. Hearing intact. Voice is normal. Shoulder shrug is normal. The tongue has normal motion without fasciculations.   Coordination:    Normal   Gait: normal.   Motor Observation:    No asymmetry, no atrophy, and no involuntary movements noted. Tone:    Normal muscle tone.    Posture:    Posture is normal. normal erect    Strength:    Strength is V/V in the upper and lower limbs.      Sensation: intact to LT     Reflex Exam:  DTR's:    Deep tendon reflexes in the upper and lower extremities are normal bilaterally.   Toes:    The toes are downgoing bilaterally.   Clonus:    Clonus is absent.    Assessment/Plan:  Patient with migraines, failed multiple treatments, discussed options  Options prevention: Nurtec, Emgality, Vyepti, Botox(makes her nervous), qulipta Acute Management: Had bad reaction to imitrex with chest tightening but no anaphylaxis and tachycardia and throat tightening, nurtec, ubrelvy  MRI of the brain w/wo contrast: MRI brain due to concerning symptoms of morning headaches, positional and vision changes, worsening headaches  to look for space occupying mass, chiari or intracranial hypertension (pseudotumor), strokes, malignancies, vasculidities, demyelination(multiple sclerosis) or other  Sleep study : morning headaches, fatigue, snoring Start Qulipta Daily at prevention Ubrelvy as needed  Failed topamax, propanolol, aimovig, ajovy, amitriptyline/nortriptyline, maxalt, imitrex, verapamil, lisinopril. 6 migraine days a month, < 14 total headache days a month  Orders Placed This Encounter  Procedures   MR BRAIN W WO CONTRAST   CBC with  Differential/Platelets   Comprehensive metabolic panel   TSH   Ambulatory referral to Sleep Studies   Meds ordered this encounter  Medications  Atogepant (QULIPTA) 60 MG TABS    Sig: Take 60 mg by mouth daily.    Dispense:  30 tablet    Refill:  11    Failed topamax, propanolol, aimovig, ajovy, amitriptyline/nortriptyline, maxalt, imitrex, verapamil, lisinopril. 6 migraine days a month, < 14 total headache days a month   Ubrogepant (UBRELVY) 100 MG TABS    Sig: Take 100 mg by mouth every 2 (two) hours as needed. Maximum 200mg  a day.    Dispense:  16 tablet    Refill:  11    Failed topamax, propanolol, aimovig, ajovy, amitriptyline/nortriptyline, maxalt, imitrex, verapamil, lisinopril. 6 migraine days a month, < 14 total headache days a month   Discussed: To prevent or relieve headaches, try the following: Cool Compress. Lie down and place a cool compress on your head.  Avoid headache triggers. If certain foods or odors seem to have triggered your migraines in the past, avoid them. A headache diary might help you identify triggers.  Include physical activity in your daily routine. Try a daily walk or other moderate aerobic exercise.  Manage stress. Find healthy ways to cope with the stressors, such as delegating tasks on your to-do list.  Practice relaxation techniques. Try deep breathing, yoga, massage and visualization.  Eat regularly. Eating regularly scheduled meals and maintaining a healthy diet might help prevent headaches. Also, drink plenty of fluids.  Follow a regular sleep schedule. Sleep deprivation might contribute to headaches Consider biofeedback. With this mind-body technique, you learn to control certain bodily functions -- such as muscle tension, heart rate and blood pressure -- to prevent headaches or reduce headache pain.    Proceed to emergency room if you experience new or worsening symptoms or symptoms do not resolve, if you have new neurologic symptoms or if  headache is severe, or for any concerning symptom.   Provided education and documentation from American headache Society toolbox including articles on: chronic migraine medication overuse headache, chronic migraines, prevention of migraines, behavioral and other nonpharmacologic treatments for headache.  Cc: Lanelle Bal, Fonnie Mu, PA-C  Sarina Ill, MD  [facility] [address] [place], [place] [address]  Phone [phone] Fax [phone]

## 2022-05-17 LAB — CBC WITH DIFFERENTIAL/PLATELET
Basophils Absolute: 0.1 10*3/uL (ref 0.0–0.2)
Basos: 1 %
EOS (ABSOLUTE): 0.3 10*3/uL (ref 0.0–0.4)
Eos: 3 %
Hematocrit: 41.3 % (ref 34.0–46.6)
Hemoglobin: 13.9 g/dL (ref 11.1–15.9)
Immature Grans (Abs): 0.1 10*3/uL (ref 0.0–0.1)
Immature Granulocytes: 1 %
Lymphocytes Absolute: 2.9 10*3/uL (ref 0.7–3.1)
Lymphs: 29 %
MCH: 30.3 pg (ref 26.6–33.0)
MCHC: 33.7 g/dL (ref 31.5–35.7)
MCV: 90 fL (ref 79–97)
Monocytes Absolute: 0.7 10*3/uL (ref 0.1–0.9)
Monocytes: 7 %
Neutrophils Absolute: 6 10*3/uL (ref 1.4–7.0)
Neutrophils: 59 %
Platelets: 219 10*3/uL (ref 150–450)
RBC: 4.58 x10E6/uL (ref 3.77–5.28)
RDW: 13.1 % (ref 11.7–15.4)
WBC: 10.1 10*3/uL (ref 3.4–10.8)

## 2022-05-17 LAB — COMPREHENSIVE METABOLIC PANEL
ALT: 16 IU/L (ref 0–32)
AST: 17 IU/L (ref 0–40)
Albumin/Globulin Ratio: 1.7 (ref 1.2–2.2)
Albumin: 4.3 g/dL (ref 3.8–4.9)
Alkaline Phosphatase: 120 IU/L (ref 44–121)
BUN/Creatinine Ratio: 20 (ref 9–23)
BUN: 22 mg/dL (ref 6–24)
Bilirubin Total: <0.2 mg/dL (ref 0.0–1.2)
CO2: 21 mmol/L (ref 20–29)
Calcium: 10 mg/dL (ref 8.7–10.2)
Chloride: 105 mmol/L (ref 96–106)
Creatinine, Ser: 1.09 mg/dL — ABNORMAL HIGH (ref 0.57–1.00)
Globulin, Total: 2.6 g/dL (ref 1.5–4.5)
Glucose: 88 mg/dL (ref 70–99)
Potassium: 4.4 mmol/L (ref 3.5–5.2)
Sodium: 141 mmol/L (ref 134–144)
Total Protein: 6.9 g/dL (ref 6.0–8.5)
eGFR: 59 mL/min/{1.73_m2} — ABNORMAL LOW (ref >59)

## 2022-05-17 LAB — TSH: TSH: 1.48 u[IU]/mL (ref 0.450–4.500)

## 2022-05-23 ENCOUNTER — Telehealth: Payer: Self-pay | Admitting: *Deleted

## 2022-05-23 NOTE — Telephone Encounter (Signed)
Completed Bernita Raisin PA on Cover My Meds. Key: [id number].   Approved today CaseId:[id number];Status:Approved;Review Type:Prior Auth;Coverage Start Date:04/23/2022;Coverage End Date:05/23/2023;  Patient will get a text update from Cover My Meds.

## 2022-05-29 ENCOUNTER — Telehealth: Payer: Self-pay | Admitting: Neurology

## 2022-05-29 NOTE — Telephone Encounter (Signed)
[facility] Berkley Harvey: [id number] exp. 05/23/22-08/21/22 sent to [facility]

## 2022-08-14 ENCOUNTER — Encounter: Payer: Self-pay | Admitting: Neurology

## 2022-08-14 ENCOUNTER — Telehealth (INDEPENDENT_AMBULATORY_CARE_PROVIDER_SITE_OTHER): Payer: Commercial Managed Care - PPO | Admitting: Neurology

## 2022-08-14 DIAGNOSIS — R519 Headache, unspecified: Secondary | ICD-10-CM | POA: Diagnosis not present

## 2022-08-14 DIAGNOSIS — R0683 Snoring: Secondary | ICD-10-CM

## 2022-08-14 DIAGNOSIS — G4719 Other hypersomnia: Secondary | ICD-10-CM | POA: Diagnosis not present

## 2022-08-14 DIAGNOSIS — G43009 Migraine without aura, not intractable, without status migrainosus: Secondary | ICD-10-CM

## 2022-08-14 NOTE — Progress Notes (Signed)
[facility]    Provider:  Dr Lucia Gaskins Requesting Provider: Lianne Moris, PA-C Primary Care Provider:  Lianne Moris, PA-C  CC:  migraines  Virtual Visit via Video Note  I connected with Patricia Silva on 08/14/22 at  3:30 PM EDT by a video enabled telemedicine application and verified that I am speaking with the correct person using two identifiers.  Location: Patient: home Provider: office   I discussed the limitations of evaluation and management by telemedicine and the availability of in person appointments. The patient expressed understanding and agreed to proceed.     Follow Up Instructions:    I discussed the assessment and treatment plan with the patient. The patient was provided an opportunity to ask questions and all were answered. The patient agreed with the plan and demonstrated an understanding of the instructions.   The patient was advised to call back or seek an in-person evaluation if the symptoms worsen or if the condition fails to improve as anticipated.  I provided 20 minutes of non-face-to-face time during this encounter.   Anson Fret, MD   08/14/2022: She has used Bernita Raisin 6 times. She has not had the sleep study yet and I advised she call and schedule appointment with physician and maybe they will see her over video. She did not get the MRI either. She is improved in migraine severity, still having headaches which is why a sleep test woul dhelp to see if there is any other cause, if the qulipta is helping with the severity a lot we can continue until she can get the sleep evaluation.   Patient complains of symptoms per HPI as well as the following symptoms: headache . Pertinent negatives and positives per HPI. All others negative   HPI:  Patricia Silva is a 56 y.o. female here as requested by Lianne Moris, PA-C for migraines. Here with husband who also provides information.  She has a past medical history of hypertension,  migraines, Bell's palsy.  We saw her in the past in 2019 and last saw her in April 2020.  I reviewed my notes it appears we tried her on Aimovig and Ajovy.  Aimovig worked great but gave her severe constipation.  Then we gave her Ajovy and we have not seen her since April 2020 at that time she was not interested in Botox and it does not appear as though she ever did get it.  [facility], Lianne Moris, she presented with headache, located diffusely, acute and worsening, states the symptoms started 6 months ago but we did see her again in 2019 for migraines.  Examination was normal.  Patient states Migraines started at the age of 18. The last time we saw her the aimovig worked great but gave her bad constipation, Ajovy didn't help at all. Her migraines are pulsating/pounding/throbbing, can be unilatreal or bilateral, nausea, phono/photophobia, nausea. Movement makes it worse. She has vomited in a while. Daily headaches. She has had several MRIs of the brain but none for years. But migraines are worsening, wake with them, worse supine in the morning. She works at hospice. 6 moderate to severe migraines a month that can last 12-72 hours. <14 headache days a mpnth. No aura. affecting her life, blurry vision, she has lost vision, extreme light sensitivity. We were discussing botox when the pandemic hit. Her migraine started at the age of 66.She has morning headaches. She is very tired. Discussed sleep apnea, wakes up a lot. +snoring.  No other focal neurologic deficits, associated symptoms, inciting events or modifiable factors.   Reviewed notes, labs and imaging from outside physicians, which showed:  From a thorough review of records, she has tried propranolol, lisinopril, topiramate, amitriptyline/nortriptyline, Tylenol, aspirin, diclofenac, Aimovig, Ajovy, ibuprofen, meclizine, metoprolol, Zofran, prednisone, Phenergan, verapamil, Botox? Triptans contraindicated: Had bad  reaction to imitrex, maxalt with chest tightening but no anaphylaxis and tachycardia and throat tightening  Review of Systems: Patient complains of symptoms per HPI as well as the following symptoms headaches. Pertinent negatives and positives per HPI. All others negative.   Social History   Socioeconomic History   Marital status: Married    Spouse name: Not on file   Number of children: 4   Years of education: Not on file   Highest education level: Associate degree: academic program  Occupational History   Occupation: Nurse  Tobacco Use   Smoking status: Never   Smokeless tobacco: Never  Vaping Use   Vaping Use: Never used  Substance and Sexual Activity   Alcohol use: Not Currently   Drug use: No   Sexual activity: Not on file  Other Topics Concern   Not on file  Social History Narrative   Lives with husband    Caffeine use: chocolate   No drinks.   Right handed   Social Determinants of Health   Financial Resource Strain: Not on file  Food Insecurity: Not on file  Transportation Needs: Not on file  Physical Activity: Not on file  Stress: Not on file  Social Connections: Not on file  Intimate Partner Violence: Not on file    Family History  Problem Relation Age of Onset   Pulmonary embolism Mother    Diabetes Mother    Deep vein thrombosis Mother    Clotting disorder Mother    High blood pressure Mother    Migraines Mother    Heart disease Father    Prostate cancer Father    COPD Father    High blood pressure Father    Migraines Brother    Heart disease Other    Arthritis Other    Lung disease Other    Cancer Other    Migraines Sister    Pulmonary embolism Maternal Aunt    Migraines Daughter    Migraines Daughter    Migraines Son    Pulmonary embolism Cousin    Pulmonary embolism Maternal Uncle    Colon cancer Neg Hx     Past Medical History:  Diagnosis Date   History of Bell's palsy    History of pneumonia    HTN (hypertension)    Migraines     Migraines    Personal history of PE (pulmonary embolism) 2011   Vertigo     Patient Active Problem List   Diagnosis Date Noted   Rectal bleeding 09/22/2019   Diarrhea 09/22/2019   Chronic migraine without aura, with intractable migraine, so stated, with status migrainosus 08/18/2016   Unstable angina ([place]) 12/06/2013   Chest pain 12/05/2013   History of pulmonary embolism 12/05/2013   History of Bell's palsy 12/05/2013   Migraines    HTN (hypertension)    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   Rotator cuff syndrome of left shoulder 10/16/2011    Past Surgical History:  Procedure Laterality Date   APPENDECTOMY  2001   BIOPSY  12/03/2019   Procedure: BIOPSY;  Surgeon: Daneil Dolin, MD;  [facility];  Service: Endoscopy;;   CHOLECYSTECTOMY     COLONOSCOPY  N/A 12/03/2019   Procedure: COLONOSCOPY;  Surgeon: Corbin Ade, MD;  Location: [facility];  Service: Endoscopy;  Laterality: N/A;  8:45am   DILATION AND CURETTAGE OF UTERUS  1984   LEFT HEART CATHETERIZATION WITH CORONARY ANGIOGRAM N/A 12/08/2013   Procedure: LEFT HEART CATHETERIZATION WITH CORONARY ANGIOGRAM;  Surgeon: Corky Crafts, MD;  Location: [facility] CATH LAB;  Service: Cardiovascular;  Laterality: N/A;   PARTIAL HYSTERECTOMY  2009   vaginal    Current Outpatient Medications  Medication Sig Dispense Refill   acetaminophen (TYLENOL) 325 MG tablet Take 1,300 mg by mouth every 6 (six) hours as needed for mild pain or headache.     aspirin-acetaminophen-caffeine (EXCEDRIN MIGRAINE) 250-250-65 MG tablet Take 2 tablets by mouth every 6 (six) hours as needed for headache.     Atogepant (QULIPTA) 60 MG TABS Take 60 mg by mouth daily. 30 tablet 11   Calcium Carbonate Antacid (TUMS PO) Take by mouth as needed.     ibuprofen (ADVIL,MOTRIN) 200 MG tablet Take 800 mg by mouth as needed.     propranolol (INDERAL) 60 MG tablet Take 60 mg by mouth at bedtime.      Pseudoephedrine HCl (SUDOGEST PO) Take by mouth  as needed.     Ubrogepant (UBRELVY) 100 MG TABS Take 100 mg by mouth every 2 (two) hours as needed. Maximum 200mg  a day. 16 tablet 11   No current facility-administered medications for this visit.    Allergies as of 08/14/2022 - Review Complete 08/14/2022  Allergen Reaction Noted   Imitrex [sumatriptan base] Anaphylaxis and Other (See Comments) 10/03/2011   Morphine and related Shortness Of Breath and Itching 10/03/2011   Ambien [zolpidem tartrate] Other (See Comments) 12/03/2019   Aimovig [erenumab-aooe]  01/02/2019   Codeine Palpitations 08/18/2016    Vitals: There were no vitals taken for this visit. Last Weight:  Wt Readings from Last 1 Encounters:  05/16/22 159 lb 6.4 oz (72.3 kg)   Last Height:   Ht Readings from Last 1 Encounters:  05/16/22 5\' 2"  (1.575 m)    Physical exam: Exam: Gen: NAD, conversant      CV: Denies palpitations or chest pain or SOB. VS: Breathing at a normal rate. Not febrile. Eyes: Conjunctivae clear without exudates or hemorrhage  Neuro: Detailed Neurologic Exam  Speech:    Speech is normal; fluent and spontaneous with normal comprehension.  Cognition:    The patient is oriented to person, place, and time;     recent and remote memory intact;     language fluent;     normal attention, concentration,     fund of knowledge Cranial Nerves:    The pupils are equal, round, and reactive to light.  Visual fields are full. Extraocular movements are intact.  The face is symmetric with normal sensation. The palate elevates in the midline. Hearing intact. Voice is normal. Shoulder shrug is normal. The tongue has normal motion without fasciculations.   Coordination:    Normal   Motor Observation:   no involuntary movements noted. Tone:    Appears normal  Posture:    Posture is normal. normal erect    Strength:    Strength is anti-gravity and symmetric in the upper limbs.        Assessment/Plan:  Patient with migraines, failed multiple  treatments, discussed options  CONTINUE QULIPTA CONTINUE UBRELVY GET MRI BRAIN IF CAN HIGHLY RECOMMEND SLEEP EVALUATION: Sleep study : morning headaches, fatigue, snoring  Discussed:  Options prevention: Nurtec, Emgality,  Vyepti, Botox(makes her nervous), qulipta Acute Management: Had bad reaction to imitrex with chest tightening but no anaphylaxis and tachycardia and throat tightening, nurtec, ubrelvy  MRI of the brain w/wo contrast: MRI brain due to concerning symptoms of morning headaches, positional and vision changes, worsening headaches  to look for space occupying mass, chiari or intracranial hypertension (pseudotumor), strokes, malignancies, vasculidities, demyelination(multiple sclerosis) or other  Failed topamax, propanolol, aimovig, ajovy, amitriptyline/nortriptyline, maxalt, imitrex, verapamil, lisinopril. 6 migraine days a month, < 14 total headache days a month  Discussed: To prevent or relieve headaches, try the following: Cool Compress. Lie down and place a cool compress on your head.  Avoid headache triggers. If certain foods or odors seem to have triggered your migraines in the past, avoid them. A headache diary might help you identify triggers.  Include physical activity in your daily routine. Try a daily walk or other moderate aerobic exercise.  Manage stress. Find healthy ways to cope with the stressors, such as delegating tasks on your to-do list.  Practice relaxation techniques. Try deep breathing, yoga, massage and visualization.  Eat regularly. Eating regularly scheduled meals and maintaining a healthy diet might help prevent headaches. Also, drink plenty of fluids.  Follow a regular sleep schedule. Sleep deprivation might contribute to headaches Consider biofeedback. With this mind-body technique, you learn to control certain bodily functions -- such as muscle tension, heart rate and blood pressure -- to prevent headaches or reduce headache pain.    Proceed to  emergency room if you experience new or worsening symptoms or symptoms do not resolve, if you have new neurologic symptoms or if headache is severe, or for any concerning symptom.   Provided education and documentation from American headache Society toolbox including articles on: chronic migraine medication overuse headache, chronic migraines, prevention of migraines, behavioral and other nonpharmacologic treatments for headache.  Cc: Lianne Moris, Dell Ponto, PA-C  Naomie Dean, MD  [facility] [address] [place], [place] [address]  Phone [phone] Fax [phone]

## 2022-08-14 NOTE — Patient Instructions (Signed)
CONTINUE QULIPTA CONTINUE UBRELVY GET MRI BRAIN IF CAN HIGHLY RECOMMEND SLEEP EVALUATION: Sleep study : morning headaches, fatigue, snoring

## 2022-09-30 ENCOUNTER — Other Ambulatory Visit (HOSPITAL_BASED_OUTPATIENT_CLINIC_OR_DEPARTMENT_OTHER): Payer: Self-pay

## 2022-09-30 DIAGNOSIS — R519 Headache, unspecified: Secondary | ICD-10-CM

## 2022-09-30 DIAGNOSIS — R5383 Other fatigue: Secondary | ICD-10-CM

## 2022-09-30 DIAGNOSIS — R0683 Snoring: Secondary | ICD-10-CM

## 2022-09-30 DIAGNOSIS — G4710 Hypersomnia, unspecified: Secondary | ICD-10-CM

## 2022-10-18 ENCOUNTER — Encounter (HOSPITAL_COMMUNITY): Payer: Commercial Managed Care - PPO

## 2022-10-19 ENCOUNTER — Other Ambulatory Visit (HOSPITAL_COMMUNITY): Payer: Self-pay | Admitting: Family Medicine

## 2022-10-19 ENCOUNTER — Ambulatory Visit (HOSPITAL_COMMUNITY)
Admission: RE | Admit: 2022-10-19 | Discharge: 2022-10-19 | Disposition: A | Discharge status: Home / Self Care | Payer: Commercial Managed Care - PPO | Source: Ambulatory Visit | Attending: Cardiology | Admitting: Cardiology

## 2022-10-19 DIAGNOSIS — M79662 Pain in left lower leg: Secondary | ICD-10-CM | POA: Diagnosis not present

## 2023-01-18 ENCOUNTER — Telehealth: Payer: Self-pay | Admitting: Neurology

## 2023-01-18 DIAGNOSIS — G43009 Migraine without aura, not intractable, without status migrainosus: Secondary | ICD-10-CM

## 2023-01-18 NOTE — Telephone Encounter (Signed)
Pt reports that she is out of her QULIPTA and has not had it in over a week.  Pt states her boxes show that she has 12 refills on both QULIPTA & UBRELVY.  Pt states she has called the pharmacy and received a text that they are unable to process at this time.  Pt is asking for a call re: what can be done.

## 2023-01-22 MED ORDER — UBRELVY 100 MG PO TABS: 100.0000 mg | ORAL_TABLET | ORAL | 11 refills | Status: AC | PRN

## 2023-01-22 MED ORDER — QULIPTA 60 MG PO TABS: 60.0000 mg | ORAL_TABLET | Freq: Every day | ORAL | 11 refills | Status: DC

## 2023-01-22 NOTE — Addendum Note (Signed)
Addended by: Guy Begin on: 01/22/2023 03:54 PM   Modules accepted: Orders

## 2023-01-22 NOTE — Telephone Encounter (Addendum)
I called [facility] and inquired about [facility].  She said that since both [place] covered medications on her insurance they would need to go to [facility]. I have [facility].  Can send there.  Pt stated that was ok. Sent to [facility] both [place].

## 2023-01-22 NOTE — Telephone Encounter (Signed)
[facility] at lunch.

## 2023-03-15 ENCOUNTER — Telehealth: Payer: Self-pay

## 2023-03-15 ENCOUNTER — Other Ambulatory Visit (HOSPITAL_COMMUNITY): Payer: Self-pay

## 2023-03-15 DIAGNOSIS — G43009 Migraine without aura, not intractable, without status migrainosus: Secondary | ICD-10-CM

## 2023-03-15 NOTE — Telephone Encounter (Signed)
Patient Advocate Encounter   Received notification from [employer] that prior authorization is required for Ubrelvy 100MG  tablets   Submitted: n/a Key [id number]  This does not need a prior authorizatoin. Insurance limits #10 per 30 days and has an active PA effective until 05-23-2023

## 2023-03-15 NOTE — Telephone Encounter (Signed)
Patient Advocate Encounter  Prior authorization for Qulipta 60MG  tablets submitted and APPROVED through [employer].  Test billing returns $0.00 copay for 30 day supply.  Key [id number] Effective: 03-15-2023 - 03-13-2024

## 2023-03-19 MED ORDER — QULIPTA 60 MG PO TABS: 60.0000 mg | ORAL_TABLET | Freq: Every day | ORAL | 11 refills | Status: DC

## 2023-03-19 NOTE — Addendum Note (Signed)
Addended by: Christophe Louis E on: 03/19/2023 09:06 AM   Modules accepted: Orders

## 2023-03-19 NOTE — Addendum Note (Signed)
Addended by: Sylvana Bonk E on: 03/19/2023 09:06 AM   Modules accepted: Orders  

## 2023-03-19 NOTE — Telephone Encounter (Signed)
I left a voicemail (as per DPR). I informed the patient that the medication had been approved and would be shipped via [facility]. The office number was provided for any questions or concerns. Rx sent to [facility].

## 2023-05-28 ENCOUNTER — Telehealth: Payer: Self-pay

## 2023-05-28 ENCOUNTER — Other Ambulatory Visit (HOSPITAL_COMMUNITY): Payer: Self-pay

## 2023-05-28 NOTE — Telephone Encounter (Signed)
Pharmacy Patient Advocate Encounter   Received notification from CoverMyMeds that prior authorization for Ubrelvy 100MG  tablets is required/requested.   Insurance verification completed.   The patient is insured through [employer] .   Per test claim: PA required; PA started via CoverMyMeds. KEY [id number] . Waiting for clinical questions to populate.

## 2023-05-30 NOTE — Telephone Encounter (Signed)
Clinical questions have been submitted-awaiting determination. 

## 2023-05-30 NOTE — Telephone Encounter (Signed)
Pharmacy Patient Advocate Encounter  Received notification from [employer] [facility] that Prior Authorization for Ubrelvy 100MG  tablets has been APPROVED from 04/30/2023 to 05/29/2024.   PA #/Case ID/Reference #: PA Case ID #: [id number]

## 2024-02-15 ENCOUNTER — Telehealth: Payer: Self-pay

## 2024-02-15 NOTE — Telephone Encounter (Signed)
 Pharmacy Patient Advocate Encounter   Received notification from CoverMyMeds that prior authorization for Ubrelvy  100MG  tablets is due for renewal.   Insurance verification completed.   The patient is insured through [facility].  Action: Patient hasn't been seen in your office in over a year. Plan requires updated chart notes for PA renewal.

## 2024-02-18 NOTE — Telephone Encounter (Signed)
 Ok please disregard this auto-renewal request as it was not triggered by the pharmacy. She never scheduled another f/u after 07/2022 visit.

## 2024-05-14 ENCOUNTER — Encounter: Payer: Self-pay | Admitting: Gastroenterology

## 2024-06-01 NOTE — Progress Notes (Unsigned)
 GI Office Note    Referring Provider: Dow Longs, PA-C Primary Care Physician:  Dow Longs, PA-C  Primary Gastroenterologist: Ozell Hollingshead, MD   Chief Complaint   No chief complaint on file.    History of Present Illness   Patricia Silva is a 58 y.o. female presenting today      Prior Data   Colonoscopy February 2021: - Entire examined colon normal. - Hemorrhoids likely source of hematochezia - Status post segmental biopsy, benign - Next colonoscopy in 10 years  Patient reports history of remote EGD with esophageal dilation back in the 90s by Dr. Hollingshead.  Records not available.  Medications   Current Outpatient Medications  Medication Sig Dispense Refill   acetaminophen  (TYLENOL ) 325 MG tablet Take 1,300 mg by mouth every 6 (six) hours as needed for mild pain or headache.     aspirin -acetaminophen -caffeine (EXCEDRIN MIGRAINE) 250-250-65 MG tablet Take 2 tablets by mouth every 6 (six) hours as needed for headache.     Atogepant  (QULIPTA ) 60 MG TABS Take 1 tablet (60 mg total) by mouth daily. 30 tablet 11   Calcium Carbonate Antacid (TUMS PO) Take by mouth as needed.     ibuprofen (ADVIL,MOTRIN) 200 MG tablet Take 800 mg by mouth as needed.     propranolol (INDERAL) 60 MG tablet Take 60 mg by mouth at bedtime.      Pseudoephedrine HCl (SUDOGEST PO) Take by mouth as needed.     Ubrogepant  (UBRELVY ) 100 MG TABS Take 1 tablet (100 mg total) by mouth every 2 (two) hours as needed. Maximum 200mg  a day. 16 tablet 11   No current facility-administered medications for this visit.    Allergies   Allergies as of 06/02/2024 - Review Complete 08/14/2022  Allergen Reaction Noted   Imitrex [sumatriptan base] Anaphylaxis and Other (See Comments) 10/03/2011   Morphine and codeine Shortness Of Breath and Itching 10/03/2011   Ambien [zolpidem tartrate] Other (See Comments) 12/03/2019   Aimovig  [erenumab -aooe]  01/02/2019   Codeine Palpitations 08/18/2016    Past  Medical History   Past Medical History:  Diagnosis Date   History of Bell's palsy    History of pneumonia    HTN (hypertension)    Migraines    Migraines    Personal history of PE (pulmonary embolism) 2011   Vertigo     Past Surgical History   Past Surgical History:  Procedure Laterality Date   APPENDECTOMY  2001   BIOPSY  12/03/2019   Procedure: BIOPSY;  Surgeon: Hollingshead Lamar HERO, MD;  [facility];  Service: Endoscopy;;   CHOLECYSTECTOMY     COLONOSCOPY N/A 12/03/2019   Procedure: COLONOSCOPY;  Surgeon: Hollingshead Lamar HERO, MD;  Location: [facility];  Service: Endoscopy;  Laterality: N/A;  8:45am   DILATION AND CURETTAGE OF UTERUS  1984   LEFT HEART CATHETERIZATION WITH CORONARY ANGIOGRAM N/A 12/08/2013   Procedure: LEFT HEART CATHETERIZATION WITH CORONARY ANGIOGRAM;  Surgeon: Candyce GORMAN Reek, MD;  Location: [facility] CATH LAB;  Service: Cardiovascular;  Laterality: N/A;   PARTIAL HYSTERECTOMY  2009   vaginal    Past Family History   Family History  Problem Relation Age of Onset   Pulmonary embolism Mother    Diabetes Mother    Deep vein thrombosis Mother    Clotting disorder Mother    High blood pressure Mother    Migraines Mother    Heart disease Father    Prostate cancer Father    COPD  Father    High blood pressure Father    Migraines Brother    Heart disease Other    Arthritis Other    Lung disease Other    Cancer Other    Migraines Sister    Pulmonary embolism Maternal Aunt    Migraines Daughter    Migraines Daughter    Migraines Son    Pulmonary embolism Cousin    Pulmonary embolism Maternal Uncle    Colon cancer Neg Hx     Past Social History   Social History   Socioeconomic History   Marital status: Married    Spouse name: Not on file   Number of children: 4   Years of education: Not on file   Highest education level: Associate degree: academic program  Occupational History   Occupation: Nurse  Tobacco Use   Smoking status: Never    Smokeless tobacco: Never  Vaping Use   Vaping status: Never Used  Substance and Sexual Activity   Alcohol use: Not Currently   Drug use: No   Sexual activity: Not on file  Other Topics Concern   Not on file  Social History Narrative   Lives with husband    Caffeine use: chocolate   No drinks.   Right handed   Social Drivers of Corporate investment banker Strain: Not on file  Food Insecurity: Not on file  Transportation Needs: Not on file  Physical Activity: Not on file  Stress: Not on file  Social Connections: Not on file  Intimate Partner Violence: Not on file    Review of Systems   General: Negative for anorexia, weight loss, fever, chills, fatigue, weakness. Eyes: Negative for vision changes.  ENT: Negative for hoarseness, difficulty swallowing , nasal congestion. CV: Negative for chest pain, angina, palpitations, dyspnea on exertion, peripheral edema.  Respiratory: Negative for dyspnea at rest, dyspnea on exertion, cough, sputum, wheezing.  GI: See history of present illness. GU:  Negative for dysuria, hematuria, urinary incontinence, urinary frequency, nocturnal urination.  MS: Negative for joint pain, low back pain.  Derm: Negative for rash or itching.  Neuro: Negative for weakness, abnormal sensation, seizure, frequent headaches, memory loss,  confusion.  Psych: Negative for anxiety, depression, suicidal ideation, hallucinations.  Endo: Negative for unusual weight change.  Heme: Negative for bruising or bleeding. Allergy: Negative for rash or hives.  Physical Exam   There were no vitals taken for this visit.   General: Well-nourished, well-developed in no acute distress.  Head: Normocephalic, atraumatic.   Eyes: Conjunctiva pink, no icterus. Mouth: Oropharyngeal mucosa moist and pink  Neck: Supple without thyromegaly, masses, or lymphadenopathy.  Lungs: Clear to auscultation bilaterally.  Heart: Regular rate and rhythm, no murmurs rubs or gallops.   Abdomen: Bowel sounds are normal, nontender, nondistended, no hepatosplenomegaly or masses,  no abdominal bruits or hernia, no rebound or guarding.   Rectal: not performed Extremities: No lower extremity edema. No clubbing or deformities.  Neuro: Alert and oriented x 4 , grossly normal neurologically.  Skin: Warm and dry, no rash or jaundice.   Psych: Alert and cooperative, normal mood and affect.  Labs   Labs from March 2025: White blood cell count 7.1, hemoglobin 14.1, platelets 278,000, BUN 19, creatinine 0.94, albumin 4.1, total bilirubin less than 0.2, alk phos 133 elevated, AST 16, ALT 18, A1c 6.3, B12 543, TSH 1.160.  Labs from December 2020: TTG IgA less than 2, IgA 372  Imaging Studies   No results found.  Assessment/Plan:  Single elevated alkphos      Sonny RAMAN. Ezzard, MHS, PA-C [facility]

## 2024-06-02 ENCOUNTER — Encounter: Payer: Self-pay | Admitting: Gastroenterology

## 2024-06-02 ENCOUNTER — Ambulatory Visit (INDEPENDENT_AMBULATORY_CARE_PROVIDER_SITE_OTHER): Admitting: Gastroenterology

## 2024-06-02 ENCOUNTER — Telehealth: Payer: Self-pay | Admitting: Gastroenterology

## 2024-06-02 ENCOUNTER — Encounter: Payer: Self-pay | Admitting: *Deleted

## 2024-06-02 VITALS — BP 145/89 | HR 49 | Temp 97.4°F | Ht 62.0 in | Wt 166.4 lb

## 2024-06-02 DIAGNOSIS — R1310 Dysphagia, unspecified: Secondary | ICD-10-CM | POA: Diagnosis not present

## 2024-06-02 DIAGNOSIS — K529 Noninfective gastroenteritis and colitis, unspecified: Secondary | ICD-10-CM | POA: Diagnosis not present

## 2024-06-02 DIAGNOSIS — R748 Abnormal levels of other serum enzymes: Secondary | ICD-10-CM | POA: Diagnosis not present

## 2024-06-02 DIAGNOSIS — K219 Gastro-esophageal reflux disease without esophagitis: Secondary | ICD-10-CM | POA: Diagnosis not present

## 2024-06-02 DIAGNOSIS — R1319 Other dysphagia: Secondary | ICD-10-CM

## 2024-06-02 NOTE — Patient Instructions (Addendum)
 Continue esomeprazole 40mg  daily. Typically recommend taking 30 minutes before a meal for increased efficacy.  We will schedule you for an upper endoscopy to evaluate your esophagus and complete esophageal dilation.   Until you can be evaluated, please avoid raw hard vegetables, dry tough meats. Cut meats finely, chew thoroughly.   Please let me know if you need anything between now and your procedure. It was very nice to see you!

## 2024-06-02 NOTE — Telephone Encounter (Signed)
 noted

## 2024-06-02 NOTE — Telephone Encounter (Signed)
 Pt stated that she will discuss it with her pcp

## 2024-06-02 NOTE — Telephone Encounter (Signed)
 Patricia Silva, patient seen today. After she left, I was able to review some labs done six months ago. She had elevated alk phos, likely insignificant but would recommend this be rechecked to verify not persistent and no upward trend.   We can do that for her or she can request her pcp to do it.

## 2024-06-04 NOTE — Telephone Encounter (Signed)
 PA submitted via [employer] Case ID# [id number]

## 2024-06-05 NOTE — Telephone Encounter (Signed)
 PA approved Case ID:  [id number] DOS: 07/07/2024-10/04/2024

## 2024-07-07 ENCOUNTER — Other Ambulatory Visit: Payer: Self-pay

## 2024-07-07 ENCOUNTER — Ambulatory Visit (HOSPITAL_COMMUNITY)
Admission: RE | Admit: 2024-07-07 | Discharge: 2024-07-07 | Disposition: A | Discharge status: Home / Self Care | Attending: Internal Medicine | Admitting: Internal Medicine

## 2024-07-07 ENCOUNTER — Ambulatory Visit (HOSPITAL_COMMUNITY): Admitting: Anesthesiology

## 2024-07-07 ENCOUNTER — Ambulatory Visit (HOSPITAL_BASED_OUTPATIENT_CLINIC_OR_DEPARTMENT_OTHER): Admitting: Anesthesiology

## 2024-07-07 ENCOUNTER — Encounter (HOSPITAL_COMMUNITY)
Admission: RE | Disposition: A | Discharge status: Home / Self Care | Payer: Self-pay | Source: Home / Self Care | Attending: Internal Medicine

## 2024-07-07 ENCOUNTER — Encounter (HOSPITAL_COMMUNITY): Payer: Self-pay | Admitting: Internal Medicine

## 2024-07-07 DIAGNOSIS — R1310 Dysphagia, unspecified: Secondary | ICD-10-CM

## 2024-07-07 DIAGNOSIS — I10 Essential (primary) hypertension: Secondary | ICD-10-CM | POA: Insufficient documentation

## 2024-07-07 DIAGNOSIS — R1314 Dysphagia, pharyngoesophageal phase: Secondary | ICD-10-CM | POA: Insufficient documentation

## 2024-07-07 DIAGNOSIS — K219 Gastro-esophageal reflux disease without esophagitis: Secondary | ICD-10-CM | POA: Diagnosis not present

## 2024-07-07 HISTORY — PX: ESOPHAGEAL DILATION: SHX303

## 2024-07-07 HISTORY — PX: ESOPHAGOGASTRODUODENOSCOPY: SHX5428

## 2024-07-07 SURGERY — EGD (ESOPHAGOGASTRODUODENOSCOPY)
Anesthesia: General

## 2024-07-07 MED ORDER — LIDOCAINE 2% (20 MG/ML) 5 ML SYRINGE
INTRAMUSCULAR | Status: DC | PRN
Start: 2024-07-07 — End: 2024-07-07
  Administered 2024-07-07: 60 mg via INTRAVENOUS

## 2024-07-07 MED ORDER — PROPOFOL 10 MG/ML IV BOLUS
INTRAVENOUS | Status: DC | PRN
  Administered 2024-07-07: 20 mg via INTRAVENOUS
  Administered 2024-07-07: 60 mg via INTRAVENOUS
  Administered 2024-07-07: 40 mg via INTRAVENOUS
  Administered 2024-07-07: 125 ug/kg/min via INTRAVENOUS

## 2024-07-07 MED ORDER — LACTATED RINGERS IV SOLN: INTRAVENOUS | Status: DC

## 2024-07-07 MED ORDER — STERILE WATER FOR IRRIGATION IR SOLN
Status: DC | PRN
  Administered 2024-07-07: 50 mL

## 2024-07-07 NOTE — Transfer of Care (Addendum)
 Immediate Anesthesia Transfer of Care Note  Patient: Patricia Silva  Procedure(s) Performed: EGD (ESOPHAGOGASTRODUODENOSCOPY) DILATION, ESOPHAGUS  Patient Location: Endoscopy Unit  Anesthesia Type:General  Level of Consciousness: drowsy and patient cooperative  Airway & Oxygen Therapy: Patient Spontanous Breathing and Patient connected to face mask oxygen  Post-op Assessment: Report given to RN and Post -op Vital signs reviewed and stable  Post vital signs: Reviewed and stable  Last Vitals:  Vitals Value Taken Time  BP 101/54 07/07/24   1126  Temp 36.8 07/07/24   1126  Pulse 59 07/07/24   1126  Resp 17 07/07/24   1126  SpO2 97% 07/07/24   1126    Last Pain:  Vitals:   07/07/24 1110  TempSrc:   PainSc: 2       Patients Stated Pain Goal: 3 (07/07/24 1056)  Complications: No notable events documented.

## 2024-07-07 NOTE — Op Note (Signed)
 [facility] Patient Name: Patricia Silva Procedure Date: 07/07/2024 10:53 AM MRN: [id number] Date of Birth: Dec 09, 1965 Attending MD: Lamar Ozell Hollingshead , MD, [id number] CSN: [id number] Age: 58 Admit Type: Outpatient Procedure:                Upper GI endoscopy Indications:              Dysphagia Providers:                Lamar Ozell Hollingshead, MD, Jon LABOR. Gerome RN, RN,                            Bascom Blush Referring MD:              Medicines:                Propofol  per Anesthesia Complications:            No immediate complications. Estimated Blood Loss:     Estimated blood loss: none. Procedure:                Pre-Anesthesia Assessment:                           - Prior to the procedure, a History and Physical                            was performed, and patient medications and                            allergies were reviewed. The patient's tolerance of                            previous anesthesia was also reviewed. The risks                            and benefits of the procedure and the sedation                            options and risks were discussed with the patient.                            All questions were answered, and informed consent                            was obtained. Prior Anticoagulants: The patient has                            taken no anticoagulant or antiplatelet agents. ASA                            Grade Assessment: III - A patient with severe                            systemic disease. After reviewing the risks and  benefits, the patient was deemed in satisfactory                            condition to undergo the procedure.                           After obtaining informed consent, the endoscope was                            passed under direct vision. Throughout the                            procedure, the patient's blood pressure, pulse, and                            oxygen saturations were  monitored continuously. The                            [id number] ([id number]) Upper was introduced through                            the mouth, and advanced to the second part of                            duodenum. The upper GI endoscopy was accomplished                            without difficulty. The patient tolerated the                            procedure well. Scope In: 11:14:51 AM Scope Out: 11:19:35 AM Total Procedure Duration: 0 hours 4 minutes 44 seconds  Findings:      The examined esophagus was normal.      The entire examined stomach was normal.      The duodenal bulb and second portion of the duodenum were normal. The       scope was withdrawn. Dilation was performed with a Maloney dilator with       mild resistance at 56 Fr. The dilation site was examined following       endoscope reinsertion and showed no change. Estimated blood loss: none. Impression:               - Normal esophagus. Dilated.                           - Normal stomach.                           - Normal duodenal bulb and second portion of the                            duodenum.                           - No specimens collected. Moderate Sedation:      Moderate (conscious) sedation was personally administered by  an       anesthesia professional. The following parameters were monitored: oxygen       saturation, heart rate, blood pressure, respiratory rate, EKG, adequacy       of pulmonary ventilation, and response to care. Recommendation:           - Patient has a contact number available for                            emergencies. The signs and symptoms of potential                            delayed complications were discussed with the                            patient. Return to normal activities tomorrow.                            Written discharge instructions were provided to the                            patient.                           - Advance diet as tolerated.                            - Continue present medications.                           - Return to my office in 3 months. Procedure Code(s):        --- Professional ---                           [phone], Esophagogastroduodenoscopy, flexible,                            transoral; diagnostic, including collection of                            specimen(s) by brushing or washing, when performed                            (separate procedure)                           43450, Dilation of esophagus, by unguided sound or                            bougie, single or multiple passes Diagnosis Code(s):        --- Professional ---                           R13.10, Dysphagia, unspecified CPT copyright 2022 American Medical Association. All rights reserved. The codes documented in this report are preliminary and upon coder review may  be revised to meet current compliance requirements. Lamar HERO. Zane Pellecchia, MD Lamar Ozell Hollingshead,  MD 07/07/2024 11:23:23 AM This report has been signed electronically. Number of Addenda: 0

## 2024-07-07 NOTE — H&P (Signed)
 @LOGO @   Gastroenterology Progress Note    Primary Care Physician:  Dow Longs, PA-C Primary Gastroenterologist:  Dr.   Pre-Procedure History & Physical: HPI:  Patricia Silva is a 58 y.o. female here for  for further evaluation recurrent esophageal dysphagia.  Patient reports undergoing his esophageal dilation back in the 90s here by me.  Records are not available.  Dilation helped with her   Dysphagia symptoms until recently.Patricia Silva  Nexium continues to control her reflux very well.  Past Medical History:  Diagnosis Date   History of Bell's palsy    History of pneumonia    HTN (hypertension)    Migraines    Migraines    Personal history of PE (pulmonary embolism) 2011   Vertigo     Past Surgical History:  Procedure Laterality Date   APPENDECTOMY  2001   BIOPSY  12/03/2019   Procedure: BIOPSY;  Surgeon: Shaaron Lamar HERO, MD;  Location: [facility];  Service: Endoscopy;;   CHOLECYSTECTOMY     COLONOSCOPY N/A 12/03/2019   Procedure: COLONOSCOPY;  Surgeon: Shaaron Lamar HERO, MD;  Location: [facility];  Service: Endoscopy;  Laterality: N/A;  8:45am   DILATION AND CURETTAGE OF UTERUS  1984   LEFT HEART CATHETERIZATION WITH CORONARY ANGIOGRAM N/A 12/08/2013   Procedure: LEFT HEART CATHETERIZATION WITH CORONARY ANGIOGRAM;  Surgeon: Candyce GORMAN Reek, MD;  Location: [facility] CATH LAB;  Service: Cardiovascular;  Laterality: N/A;   PARTIAL HYSTERECTOMY  2009   vaginal    Prior to Admission medications   Medication Sig Start Date End Date Taking? Authorizing Provider  acetaminophen  (TYLENOL ) 325 MG tablet Take 1,300 mg by mouth every 6 (six) hours as needed for mild pain or headache.   Yes [provider]  aspirin -acetaminophen -caffeine (EXCEDRIN MIGRAINE) 250-250-65 MG tablet Take 2 tablets by mouth every 6 (six) hours as needed for headache.   Yes [provider]  Calcium Carbonate Antacid (TUMS PO) Take by mouth as needed.   Yes [provider]  esomeprazole  (NEXIUM) 40 MG capsule Take 40 mg by mouth daily. 05/07/24  Yes [provider]  ibuprofen (ADVIL,MOTRIN) 200 MG tablet Take 800 mg by mouth as needed.   Yes [provider]  propranolol ER (INDERAL LA) 120 MG 24 hr capsule Take 120 mg by mouth daily. 05/30/24  Yes [provider]  Ubrogepant  (UBRELVY ) 100 MG TABS Take 1 tablet (100 mg total) by mouth every 2 (two) hours as needed. Maximum 200mg  a day. 01/22/23  Yes Ines Onetha NOVAK, MD    Allergies as of 06/04/2024 - Review Complete 06/02/2024  Allergen Reaction Noted   Imitrex [sumatriptan base] Anaphylaxis and Other (See Comments) 10/03/2011   Morphine and codeine Shortness Of Breath and Itching 10/03/2011   Ambien [zolpidem tartrate] Other (See Comments) 12/03/2019   Aimovig  [erenumab -aooe]  01/02/2019   Codeine Palpitations 08/18/2016    Family History  Problem Relation Age of Onset   Pulmonary embolism Mother    Diabetes Mother    Deep vein thrombosis Mother    Clotting disorder Mother    High blood pressure Mother    Migraines Mother    Heart disease Father    Prostate cancer Father    COPD Father    High blood pressure Father    Migraines Sister    Migraines Brother    Migraines Daughter    Deep vein thrombosis Daughter        first DVT on OCP. one recently with increased sedentary.  Migraines Daughter    Migraines Son    Pulmonary embolism Maternal Aunt    Pulmonary embolism Maternal Uncle    Pulmonary embolism Cousin    Heart disease Other    Arthritis Other    Lung disease Other    Cancer Other    Colon cancer Neg Hx     Social History   Socioeconomic History   Marital status: Married    Spouse name: Not on file   Number of children: 4   Years of education: Not on file   Highest education level: Associate degree: academic program  Occupational History   Occupation: Engineer, civil (consulting)   Occupation: hospice nurse  Tobacco Use   Smoking status: Never   Smokeless tobacco: Never  Vaping Use    Vaping status: Never Used  Substance and Sexual Activity   Alcohol use: Not Currently   Drug use: No   Sexual activity: Not on file  Other Topics Concern   Not on file  Social History Narrative   Lives with husband    Caffeine use: chocolate   No drinks.   Right handed   Social Drivers of Corporate investment banker Strain: Not on file  Food Insecurity: Not on file  Transportation Needs: Not on file  Physical Activity: Not on file  Stress: Not on file  Social Connections: Not on file  Intimate Partner Violence: Not on file    Review of Systems   See HPI, otherwise negative ROS  Physical Exam: BP (!) 160/83   Pulse (!) 58   Temp 97.8 F (36.6 C) (Oral)   Resp 14   Ht 5' 2 (1.575 m)   Wt 72.6 kg   SpO2 97%   BMI 29.26 kg/m  General:   Alert,  Well-developed, well-nourished, pleasant and cooperative in NAD Neck:  Supple; no masses or thyromegaly. No significant cervical adenopathy. Lungs:  Clear throughout to auscultation.   No wheezes, crackles, or rhonchi. No acute distress. Heart:  Regular rate and rhythm; no murmurs, clicks, rubs,  or gallops. Abdomen: Non-distended, normal bowel sounds.  Soft and nontender without appreciable mass or hepatosplenomegaly.   Impression/Plan:     58 year old lady with recurrent esophageal dysphagia GERD well-controlled with once daily PPI.  I have offered the patient a EGD with esophageal dilation as feasible/appropriate per plan.  The risks, benefits, limitations, alternatives and imponderables have been reviewed with the patient. Potential for esophageal dilation, biopsy, etc. have also been reviewed.  Questions have been answered. All parties agreeable.     Notice: This dictation was prepared with Dragon dictation along with smaller phrase technology. Any transcriptional errors that result from this process are unintentional and may not be corrected upon review.

## 2024-07-07 NOTE — Anesthesia Preprocedure Evaluation (Signed)
 Anesthesia Evaluation  Patient identified by MRN, date of birth, ID band Patient awake    Reviewed: Allergy & Precautions, H&P , NPO status , Patient's Chart, lab work & pertinent test results, reviewed documented beta blocker date and time   Airway Mallampati: II  TM Distance: >3 FB Neck ROM: full    Dental no notable dental hx.    Pulmonary neg pulmonary ROS   Pulmonary exam normal breath sounds clear to auscultation       Cardiovascular Exercise Tolerance: Good hypertension, + angina   Rhythm:regular Rate:Normal     Neuro/Psych  Headaches  negative psych ROS   GI/Hepatic Neg liver ROS,GERD  ,,  Endo/Other  negative endocrine ROS    Renal/GU negative Renal ROS  negative genitourinary   Musculoskeletal   Abdominal   Peds  Hematology negative hematology ROS (+)   Anesthesia Other Findings   Reproductive/Obstetrics negative OB ROS                              Anesthesia Physical Anesthesia Plan  ASA: 2  Anesthesia Plan: General   Post-op Pain Management:    Induction:   PONV Risk Score and Plan: Propofol  infusion  Airway Management Planned:   Additional Equipment:   Intra-op Plan:   Post-operative Plan:   Informed Consent: I have reviewed the patients History and Physical, chart, labs and discussed the procedure including the risks, benefits and alternatives for the proposed anesthesia with the patient or authorized representative who has indicated his/her understanding and acceptance.     Dental Advisory Given  Plan Discussed with: CRNA  Anesthesia Plan Comments:         Anesthesia Quick Evaluation

## 2024-07-07 NOTE — Discharge Instructions (Addendum)
 EGD Discharge instructions Please read the instructions outlined below and refer to this sheet in the next few weeks. These discharge instructions provide you with general information on caring for yourself after you leave the hospital. Your doctor may also give you specific instructions. While your treatment has been planned according to the most current medical practices available, unavoidable complications occasionally occur. If you have any problems or questions after discharge, please call your doctor. ACTIVITY You may resume your regular activity but move at a slower pace for the next 24 hours.  Take frequent rest periods for the next 24 hours.  Walking will help expel (get rid of) the air and reduce the bloated feeling in your abdomen.  No driving for 24 hours (because of the anesthesia (medicine) used during the test).  You may shower.  Do not sign any important legal documents or operate any machinery for 24 hours (because of the anesthesia used during the test).  NUTRITION Drink plenty of fluids.  You may resume your normal diet.  Begin with a light meal and progress to your normal diet.  Avoid alcoholic beverages for 24 hours or as instructed by your caregiver.  MEDICATIONS You may resume your normal medications unless your caregiver tells you otherwise.  WHAT YOU CAN EXPECT TODAY You may experience abdominal discomfort such as a feeling of fullness or "gas" pains.  FOLLOW-UP Your doctor will discuss the results of your test with you.  SEEK IMMEDIATE MEDICAL ATTENTION IF ANY OF THE FOLLOWING OCCUR: Excessive nausea (feeling sick to your stomach) and/or vomiting.  Severe abdominal pain and distention (swelling).  Trouble swallowing.  Temperature over 101 F (37.8 C).  Rectal bleeding or vomiting of blood.      your upper GI tract appeared normal today.  Your esophagus was nicely dilated  Continue Nexium daily  Office visit with us  in 3 months.

## 2024-07-07 NOTE — Anesthesia Procedure Notes (Signed)
 Date/Time: 07/07/2024 11:10 AM  Performed by: Para Jerelene CROME, CRNAOxygen Delivery Method: Nasal cannula Comments: OptiFlow Nasal Cannula.

## 2024-07-07 NOTE — Progress Notes (Signed)
 Please Excuse Patricia Silva from work on 07/07/2024 and 07/08/2024, as she under went a procedure on 07/07/2024. The patient may return to work on 07/09/2024.

## 2024-07-08 ENCOUNTER — Encounter (HOSPITAL_COMMUNITY): Payer: Self-pay | Admitting: Internal Medicine

## 2024-07-10 NOTE — Anesthesia Postprocedure Evaluation (Signed)
 Anesthesia Post Note  Patient: Patricia Silva  Procedure(s) Performed: EGD (ESOPHAGOGASTRODUODENOSCOPY) DILATION, ESOPHAGUS  Patient location during evaluation: Phase II Anesthesia Type: General Level of consciousness: awake Pain management: pain level controlled Vital Signs Assessment: post-procedure vital signs reviewed and stable Respiratory status: spontaneous breathing and respiratory function stable Cardiovascular status: blood pressure returned to baseline and stable Postop Assessment: no headache and no apparent nausea or vomiting Anesthetic complications: no Comments: Late entry   No notable events documented.   Last Vitals:  Vitals:   07/07/24 1126 07/07/24 1129  BP: (!) 101/54 (!) 102/56  Pulse: (!) 59 66  Resp: 17 20  Temp: 36.8 C   SpO2: 97% 96%    Last Pain:  Vitals:   07/07/24 1126  TempSrc:   PainSc: 0-No pain                 Yvonna JINNY Bosworth

## 2024-09-09 ENCOUNTER — Encounter: Payer: Self-pay | Admitting: Internal Medicine
